# Patient Record
Sex: Female | Born: 1985 | Race: Black or African American | Hispanic: No | Marital: Married | Smoking: Former smoker
Health system: Southern US, Community
[De-identification: ages and names within clinical notes are randomized; demographics above are authoritative.]

## PROBLEM LIST (undated history)

## (undated) DIAGNOSIS — J45909 Unspecified asthma, uncomplicated: Secondary | ICD-10-CM

## (undated) DIAGNOSIS — T7840XA Allergy, unspecified, initial encounter: Secondary | ICD-10-CM

## (undated) DIAGNOSIS — M1990 Unspecified osteoarthritis, unspecified site: Secondary | ICD-10-CM

## (undated) DIAGNOSIS — R51 Headache: Secondary | ICD-10-CM

## (undated) HISTORY — PX: KNEE SURGERY: SHX244

## (undated) HISTORY — PX: GUM SURGERY: SHX658

## (undated) HISTORY — DX: Headache: R51

## (undated) HISTORY — DX: Allergy, unspecified, initial encounter: T78.40XA

---
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
## 2006-03-23 ENCOUNTER — Encounter: Admission: RE | Admit: 2006-03-23 | Discharge: 2006-06-21 | Payer: Self-pay | Admitting: Family Medicine

## 2006-05-11 ENCOUNTER — Encounter: Admission: RE | Admit: 2006-05-11 | Discharge: 2006-08-09 | Payer: Self-pay | Admitting: Family Medicine

## 2006-06-15 ENCOUNTER — Ambulatory Visit: Payer: Self-pay | Admitting: Family Medicine

## 2016-01-21 ENCOUNTER — Emergency Department (HOSPITAL_COMMUNITY)
Admission: EM | Admit: 2016-01-21 | Discharge: 2016-01-21 | Disposition: A | Discharge status: Home / Self Care | Payer: Managed Care, Other (non HMO) | Attending: Emergency Medicine | Admitting: Emergency Medicine

## 2016-01-21 ENCOUNTER — Encounter (HOSPITAL_COMMUNITY): Payer: Self-pay | Admitting: Emergency Medicine

## 2016-01-21 DIAGNOSIS — Z79899 Other long term (current) drug therapy: Secondary | ICD-10-CM | POA: Insufficient documentation

## 2016-01-21 DIAGNOSIS — Z791 Long term (current) use of non-steroidal anti-inflammatories (NSAID): Secondary | ICD-10-CM | POA: Diagnosis not present

## 2016-01-21 DIAGNOSIS — Z87891 Personal history of nicotine dependence: Secondary | ICD-10-CM | POA: Diagnosis not present

## 2016-01-21 DIAGNOSIS — J039 Acute tonsillitis, unspecified: Secondary | ICD-10-CM | POA: Diagnosis not present

## 2016-01-21 DIAGNOSIS — J029 Acute pharyngitis, unspecified: Secondary | ICD-10-CM | POA: Diagnosis present

## 2016-01-21 HISTORY — DX: Unspecified osteoarthritis, unspecified site: M19.90

## 2016-01-21 LAB — RAPID STREP SCREEN (MED CTR MEBANE ONLY): Streptococcus, Group A Screen (Direct): NEGATIVE

## 2016-01-21 MED ORDER — KETOROLAC TROMETHAMINE 30 MG/ML IJ SOLN
30.0000 mg | Freq: Once | INTRAMUSCULAR | Status: DC
  Filled 2016-01-21: qty 1

## 2016-01-21 MED ORDER — ACETAMINOPHEN 500 MG PO TABS: 1000.0000 mg | ORAL_TABLET | Freq: Once | ORAL | Status: DC

## 2016-01-21 MED ORDER — KETOROLAC TROMETHAMINE 30 MG/ML IJ SOLN
30.0000 mg | Freq: Once | INTRAMUSCULAR | Status: AC
  Administered 2016-01-21: 30 mg via INTRAMUSCULAR

## 2016-01-21 MED ORDER — DEXAMETHASONE SODIUM PHOSPHATE 10 MG/ML IJ SOLN
10.0000 mg | Freq: Once | INTRAMUSCULAR | Status: AC
  Administered 2016-01-21: 10 mg via INTRAMUSCULAR
  Filled 2016-01-21: qty 1

## 2016-01-21 MED ORDER — ACETAMINOPHEN 325 MG PO TABS
650.0000 mg | ORAL_TABLET | Freq: Once | ORAL | Status: AC | PRN
  Administered 2016-01-21: 650 mg via ORAL
  Filled 2016-01-21: qty 2

## 2016-01-21 NOTE — Discharge Instructions (Signed)
There does not appear to be an emergent cause for your symptoms at this time. You may continue taking ibuprofen and Tylenol as we discussed for discomfort/fevers. Stay well-hydrated as we discussed. Follow up with your doctor in the next 2-3 days for reevaluation. Return to ED for any new or worsening symptoms as we discussed.  Tonsillitis Tonsillitis is an infection of the throat that causes the tonsils to become red, tender, and swollen. Tonsils are collections of lymphoid tissue at the back of the throat. Each tonsil has crevices (crypts). Tonsils help fight nose and throat infections and keep infection from spreading to other parts of the body for the first 18 months of life.  CAUSES Sudden (acute) tonsillitis is usually caused by infection with streptococcal bacteria. Long-lasting (chronic) tonsillitis occurs when the crypts of the tonsils become filled with pieces of food and bacteria, which makes it easy for the tonsils to become repeatedly infected. SYMPTOMS  Symptoms of tonsillitis include:  A sore throat, with possible difficulty swallowing.  White patches on the tonsils.  Fever.  Tiredness.  New episodes of snoring during sleep, when you did not snore before.  Small, foul-smelling, yellowish-white pieces of material (tonsilloliths) that you occasionally cough up or spit out. The tonsilloliths can also cause you to have bad breath. DIAGNOSIS Tonsillitis can be diagnosed through a physical exam. Diagnosis can be confirmed with the results of lab tests, including a throat culture. TREATMENT  The goals of tonsillitis treatment include the reduction of the severity and duration of symptoms and prevention of associated conditions. Symptoms of tonsillitis can be improved with the use of steroids to reduce the swelling. Tonsillitis caused by bacteria can be treated with antibiotic medicines. Usually, treatment with antibiotic medicines is started before the cause of the tonsillitis is  known. However, if it is determined that the cause is not bacterial, antibiotic medicines will not treat the tonsillitis. If attacks of tonsillitis are severe and frequent, your health care provider may recommend surgery to remove the tonsils (tonsillectomy). HOME CARE INSTRUCTIONS   Rest as much as possible and get plenty of sleep.  Drink plenty of fluids. While the throat is very sore, eat soft foods or liquids, such as sherbet, soups, or instant breakfast drinks.  Eat frozen ice pops.  Gargle with a warm or cold liquid to help soothe the throat. Mix 1/4 teaspoon of salt and 1/4 teaspoon of baking soda in 8 oz of water. SEEK MEDICAL CARE IF:   Large, tender lumps develop in your neck.  A rash develops.  A green, yellow-brown, or bloody substance is coughed up.  You are unable to swallow liquids or food for 24 hours.  You notice that only one of the tonsils is swollen. SEEK IMMEDIATE MEDICAL CARE IF:   You develop any new symptoms such as vomiting, severe headache, stiff neck, chest pain, or trouble breathing or swallowing.  You have severe throat pain along with drooling or voice changes.  You have severe pain, unrelieved with recommended medications.  You are unable to fully open the mouth.  You develop redness, swelling, or severe pain anywhere in the neck.  You have a fever. MAKE SURE YOU:   Understand these instructions.  Will watch your condition.  Will get help right away if you are not doing well or get worse.   This information is not intended to replace advice given to you by your health care provider. Make sure you discuss any questions you have with your health care  provider.   Document Released: 04/28/2005 Document Revised: 08/09/2014 Document Reviewed: 01/05/2013 Elsevier Interactive Patient Education [employer].

## 2016-01-21 NOTE — ED Notes (Signed)
Pt reports sore throat starting Sunday, reports seeing PCP on Monday and starting Augmentin.  Pt reports no improvement.  Resp e/u.

## 2016-01-21 NOTE — ED Notes (Signed)
Pt ambulated to room from waiting room. 

## 2016-01-21 NOTE — ED Provider Notes (Signed)
CSN: [id number]     Arrival date & time 01/21/16  4098 History   First MD Initiated Contact with Patient 01/21/16 [phone]     Chief Complaint  Patient presents with  . Sore Throat     (Consider location/radiation/quality/duration/timing/severity/associated sxs/prior Treatment) HPI Brooke Bryant is a 30 y.o. female here for evaluation of sore throat. Patient reports she was seen at [facility] on Sunday, had a negative strep test, but was treated with Augmentin for possible strep throat. She reports symptoms did not improve, was seen again today at [facility] and referred to ED for further evaluation. She denies any difficulties breathing, cough, drooling. She reports fevers 2 days ago, none today. She does report recent unprotected oral sex. No other modifying factors.  Past Medical History  Diagnosis Date  . Arthritis    Past Surgical History  Procedure Laterality Date  . Knee surgery Left   . Gum surgery     No family history on file. Social History  Substance Use Topics  . Smoking status: Former Smoker    Quit date: 01/21/2004  . Smokeless tobacco: Never Used  . Alcohol Use: 4.2 oz/week    7 Shots of liquor per week   OB History    No data available     Review of Systems A 10 point review of systems was completed and was negative except for pertinent positives and negatives as mentioned in the history of present illness     Allergies  Review of patient's allergies indicates no known allergies.  Home Medications   Prior to Admission medications   Medication Sig Start Date End Date Taking? Authorizing Provider  amoxicillin-clavulanate (AUGMENTIN) 875-125 MG tablet Take 1 tablet by mouth 2 (two) times daily. Started on Monday. 01/19/16 01/28/16 Yes Historical Provider, MD  ibuprofen (ADVIL,MOTRIN) 200 MG tablet Take 400 mg by mouth every 4 (four) hours as needed for fever.   Yes Historical Provider, MD  lidocaine (XYLOCAINE) 2 % solution Use as directed 5 mLs in the  mouth or throat every 6 (six) hours as needed for mouth pain.   Yes Historical Provider, MD   BP 99/78 mmHg  Pulse 93  Temp(Src) 97.9 F (36.6 C) (Oral)  Resp 18  Ht  (1.651 m)  Wt 136.986 kg  BMI 50.26 kg/m2  SpO2 98%  LMP 01/01/2016 (Exact Date) Physical Exam  Constitutional: She is oriented to person, place, and time. She appears well-developed and well-nourished.  HENT:  Head: Normocephalic and atraumatic.  Mouth/Throat: Oropharynx is clear and moist.  Bilateral tonsillitis with exudate. Uvula is midline. No glossal elevation. No trismus. Tolerating secretions well.  Eyes: Conjunctivae are normal. Pupils are equal, round, and reactive to light. Right eye exhibits no discharge. Left eye exhibits no discharge. No scleral icterus.  Neck: Normal range of motion. Neck supple.  Cardiovascular: Normal rate, regular rhythm and normal heart sounds.   Pulmonary/Chest: Effort normal and breath sounds normal. No respiratory distress. She has no wheezes. She has no rales.  Abdominal: Soft. There is no tenderness.  No organomegaly. Abdomen is soft, nontender, non-distended without rebound or guarding.  Musculoskeletal: She exhibits no tenderness.  Neurological: She is alert and oriented to person, place, and time.  Cranial Nerves II-XII grossly intact  Skin: Skin is warm and dry. No rash noted.  Psychiatric: She has a normal mood and affect.  Nursing note and vitals reviewed.   ED Course  Procedures (including critical care time) Labs Review Labs Reviewed  RAPID  STREP SCREEN (NOT AT [facility])  CULTURE, GROUP A STREP ([facility])  GC/CHLAMYDIA PROBE AMP ([place]) NOT AT [facility]    Imaging Review No results found. I have personally reviewed and evaluated these images and lab results as part of my medical decision-making.   EKG Interpretation None      MDM  Patient presentation consistent with likely viral tonsillitis. Rapid strep neg. Treated empirically emergency department with  Toradol, Decadron for symptom improvement. Low suspicion for peritonsillar or retropharyngeal abscess. Doubt other deep space infection. No trismus, glossal elevation, unilateral tonsillar swelling, uvula is midline. Pending GC chlamydia oral swab. Continue with previous Rx for augmentin Discussed with Dr. Jeraldine LootsLockwood. Discussed benefits and risks associated with CT of neck, patient prefers to try symptomatic treatment before and will return for any worsening signs to obtain CT of neck to evaluate for possible infection. States she feels much better, is ready to go home.  HDS, afebrile and appropriate for DC. Final diagnoses:  Tonsillitis with exudate       Joycie PeekBenjamin Djuan Talton, PA-C 01/22/16 1556  Gerhard Munchobert Lockwood, MD 01/22/16 2330

## 2016-01-22 LAB — GC/CHLAMYDIA PROBE AMP (~~LOC~~) NOT AT ARMC
Chlamydia: NEGATIVE
Neisseria Gonorrhea: NEGATIVE

## 2016-01-23 LAB — CULTURE, GROUP A STREP (THRC)

## 2016-01-27 ENCOUNTER — Encounter (HOSPITAL_COMMUNITY): Payer: Self-pay | Admitting: Emergency Medicine

## 2016-01-27 ENCOUNTER — Emergency Department (HOSPITAL_COMMUNITY)
Admission: EM | Admit: 2016-01-27 | Discharge: 2016-01-27 | Disposition: A | Discharge status: Home / Self Care | Payer: Managed Care, Other (non HMO) | Attending: Emergency Medicine | Admitting: Emergency Medicine

## 2016-01-27 DIAGNOSIS — L0231 Cutaneous abscess of buttock: Secondary | ICD-10-CM | POA: Insufficient documentation

## 2016-01-27 DIAGNOSIS — Z87891 Personal history of nicotine dependence: Secondary | ICD-10-CM | POA: Insufficient documentation

## 2016-01-27 NOTE — Discharge Instructions (Signed)
Abscess °An abscess is an infected area that contains a collection of pus and debris. It can occur in almost any part of the body. An abscess is also known as a furuncle or boil. °CAUSES  °An abscess occurs when tissue gets infected. This can occur from blockage of oil or sweat glands, infection of hair follicles, or a minor injury to the skin. As the body tries to fight the infection, pus collects in the area and creates pressure under the skin. This pressure causes pain. People with weakened immune systems have difficulty fighting infections and get certain abscesses more often.  °SYMPTOMS °Usually an abscess develops on the skin and becomes a painful mass that is red, warm, and tender. If the abscess forms under the skin, you may feel a moveable soft area under the skin. Some abscesses break open (rupture) on their own, but most will continue to get worse without care. The infection can spread deeper into the body and eventually into the bloodstream, causing you to feel ill.  °DIAGNOSIS  °Your caregiver will take your medical history and perform a physical exam. A sample of fluid may also be taken from the abscess to determine what is causing your infection. °TREATMENT  °Your caregiver may prescribe antibiotic medicines to fight the infection. However, taking antibiotics alone usually does not cure an abscess. Your caregiver may need to make a small cut (incision) in the abscess to drain the pus. In some cases, gauze is packed into the abscess to reduce pain and to continue draining the area. °HOME CARE INSTRUCTIONS  °· Only take over-the-counter or prescription medicines for pain, discomfort, or fever as directed by your caregiver. °· If you were prescribed antibiotics, take them as directed. Finish them even if you start to feel better. °· If gauze is used, follow your caregiver's directions for changing the gauze. °· To avoid spreading the infection: °· Keep your draining abscess covered with a  bandage. °· Wash your hands well. °· Do not share personal care items, towels, or whirlpools with others. °· Avoid skin contact with others. °· Keep your skin and clothes clean around the abscess. °· Keep all follow-up appointments as directed by your caregiver. °SEEK MEDICAL CARE IF:  °· You have increased pain, swelling, redness, fluid drainage, or bleeding. °· You have muscle aches, chills, or a general ill feeling. °· You have a fever. °MAKE SURE YOU:  °· Understand these instructions. °· Will watch your condition. °· Will get help right away if you are not doing well or get worse. °  °This information is not intended to replace advice given to you by your health care provider. Make sure you discuss any questions you have with your health care provider. °  °Document Released: 04/28/2005 Document Revised: 01/18/2012 Document Reviewed: 10/01/2011 °Elsevier Interactive Patient Education ©2016 Elsevier Inc[employer] ° °Incision and Drainage °Incision and drainage is a procedure in which a sac-like structure (cystic structure) is opened and drained. The area to be drained usually contains material such as pus, fluid, or blood.  °LET YOUR CAREGIVER KNOW ABOUT:  °· Allergies to medicine. °· Medicines taken, including vitamins, herbs, eyedrops, over-the-counter medicines, and creams. °· Use of steroids (by mouth or creams). °· Previous problems with anesthetics or numbing medicines. °· History of bleeding problems or blood clots. °· Previous surgery. °· Other health problems, including diabetes and kidney problems. °· Possibility of pregnancy, if this applies. °RISKS AND COMPLICATIONS °· Pain. °· Bleeding. °· Scarring. °· Infection. °BEFORE THE PROCEDURE  °  You may need to have an ultrasound or other imaging tests to see how large or deep your cystic structure is. Blood tests may also be used to determine if you have an infection or how severe the infection is. You may need to have a tetanus shot. °PROCEDURE  °The affected area  is cleaned with a cleaning fluid. The cyst area will then be numbed with a medicine (local anesthetic). A small incision will be made in the cystic structure. A syringe or catheter may be used to drain the contents of the cystic structure, or the contents may be squeezed out. The area will then be flushed with a cleansing solution. After cleansing the area, it is often gently packed with a gauze or another wound dressing. Once it is packed, it will be covered with gauze and tape or some other type of wound dressing.  °AFTER THE PROCEDURE  °· Often, you will be allowed to go home right after the procedure. °· You may be given antibiotic medicine to prevent or heal an infection. °· If the area was packed with gauze or some other wound dressing, you will likely need to come back in 1 to 2 days to get it removed. °· The area should heal in about 14 days. °  °This information is not intended to replace advice given to you by your health care provider. Make sure you discuss any questions you have with your health care provider. °  °Document Released: 01/12/2001 Document Revised: 01/18/2012 Document Reviewed: 09/13/2011 °Elsevier Interactive Patient Education ©2016 Elsevier Inc. ° °

## 2016-01-27 NOTE — ED Provider Notes (Signed)
CSN: [id number]     Arrival date & time 01/27/16  0509 History   First MD Initiated Contact with Patient 01/27/16 0703     Chief Complaint  Patient presents with  . Abscess   Patient is a 30 y.o. female presenting with abscess. The history is provided by the patient.  Abscess Location:  Ano-genital Ano-genital abscess location:  R buttock Abscess quality: fluctuance, induration and painful   Abscess quality: not draining and not weeping   Red streaking: no   Duration:  1 week Progression:  Worsening Pain details:    Quality:  Dull   Severity:  Moderate Relieved by:  Nothing Exacerbated by: palpation. Associated symptoms: no anorexia, no fatigue, no fever, no headaches, no nausea and no vomiting   Risk factors: prior abscess   Usually they drain on their own but this one has not.  Past Medical History  Diagnosis Date  . Arthritis    Past Surgical History  Procedure Laterality Date  . Knee surgery Left   . Gum surgery     No family history on file. Social History  Substance Use Topics  . Smoking status: Former Smoker    Quit date: 01/21/2004  . Smokeless tobacco: Never Used  . Alcohol Use: Yes   OB History    No data available     Review of Systems  Constitutional: Negative for fever and fatigue.  Gastrointestinal: Negative for nausea, vomiting and anorexia.  Neurological: Negative for headaches.      Allergies  Review of patient's allergies indicates no known allergies.  Home Medications   Prior to Admission medications   Medication Sig Start Date End Date Taking? Authorizing Provider  albuterol (PROVENTIL HFA;VENTOLIN HFA) 108 (90 Base) MCG/ACT inhaler Inhale 1-2 puffs into the lungs every 6 (six) hours as needed for wheezing or shortness of breath.   Yes Historical Provider, MD  amoxicillin-clavulanate (AUGMENTIN) 875-125 MG tablet Take 1 tablet by mouth 2 (two) times daily.  01/19/16 01/28/16 Yes Historical Provider, MD  ibuprofen (ADVIL,MOTRIN) 200 MG  tablet Take 400 mg by mouth every 4 (four) hours as needed for fever.   Yes Historical Provider, MD  lidocaine (XYLOCAINE) 2 % solution Use as directed 5 mLs in the mouth or throat every 6 (six) hours as needed for mouth pain.   Yes Historical Provider, MD  miconazole (MICOTIN) 100 MG vaginal suppository Place 100 mg vaginally at bedtime. For 7 days   Yes Historical Provider, MD   BP 118/61 mmHg  Pulse 70  Temp(Src) 97.8 F (36.6 C) (Oral)  Resp 18  Ht 5\' 5"  (1.651 m)  Wt 137.893 kg  BMI 50.59 kg/m2  SpO2 97%  LMP 01/07/2016 (Approximate) Physical Exam  Constitutional: She appears well-developed and well-nourished. No distress.  Obese   HENT:  Head: Normocephalic and atraumatic.  Right Ear: External ear normal.  Left Ear: External ear normal.  Eyes: Conjunctivae are normal. Right eye exhibits no discharge. Left eye exhibits no discharge. No scleral icterus.  Neck: Neck supple. No tracheal deviation present.  Cardiovascular: Normal rate.   Pulmonary/Chest: Effort normal. No stridor. No respiratory distress.  Musculoskeletal: She exhibits no edema.  approx 2 cm area of fluctuance, ttp, mild induration right buttock, medial aspect, no lymphangitic streaking  Neurological: She is alert. Cranial nerve deficit: no gross deficits.  Skin: Skin is warm and dry. No rash noted.  Psychiatric: She has a normal mood and affect.  Nursing note and vitals reviewed.   ED Course  ..[name]  Incision and Drainage Date/Time: 01/27/2016 7:45 AM Performed by: Linwood DibblesKNAPP, Ziare Orrick Authorized by: Linwood DibblesKNAPP, Arlethia Basso Consent: Verbal consent obtained. Risks and benefits: risks, benefits and alternatives were discussed Consent given by: patient Time out: Immediately prior to procedure a "time out" was called to verify the correct patient, procedure, equipment, support staff and site/side marked as required. Type: abscess Body area: anogenital Anesthesia: local infiltration Local anesthetic: lidocaine 1% without  epinephrine Anesthetic total: 3 ml Patient sedated: no Scalpel size: 11 Incision type: single straight Incision depth: subcutaneous Complexity: complex Drainage: purulent Drainage amount: moderate Wound treatment: wound left open Packing material: 1/4 in iodoform gauze Patient tolerance: Patient tolerated the procedure well with no immediate complications    MDM   Final diagnoses:  Abscess of buttock, right   I&D performed without difficulty.  Pt is feeling much better after the procedure.  Wound was irrigated and a small amount of packing was placed.  Instructed pt to remove the 1/4 gauze in tomorrow.  Continue warm soaks.   Linwood DibblesJon Sunita Demond, MD 01/27/16 [phone]

## 2016-01-27 NOTE — ED Notes (Signed)
Pt. reports skin abscess at right buttocks and lower abdomen onset this week with no drainage .

## 2017-07-18 DIAGNOSIS — Z3490 Encounter for supervision of normal pregnancy, unspecified, unspecified trimester: Secondary | ICD-10-CM | POA: Insufficient documentation

## 2017-07-19 ENCOUNTER — Ambulatory Visit (INDEPENDENT_AMBULATORY_CARE_PROVIDER_SITE_OTHER): Payer: 59 | Admitting: Obstetrics

## 2017-07-19 ENCOUNTER — Other Ambulatory Visit: Payer: Self-pay

## 2017-07-19 ENCOUNTER — Encounter: Payer: Self-pay | Admitting: Obstetrics

## 2017-07-19 VITALS — BP 121/64 | HR 73 | Temp 98.7°F | Wt 282.7 lb

## 2017-07-19 DIAGNOSIS — Z23 Encounter for immunization: Secondary | ICD-10-CM | POA: Diagnosis not present

## 2017-07-19 DIAGNOSIS — Z113 Encounter for screening for infections with a predominantly sexual mode of transmission: Secondary | ICD-10-CM | POA: Diagnosis not present

## 2017-07-19 DIAGNOSIS — O99211 Obesity complicating pregnancy, first trimester: Secondary | ICD-10-CM

## 2017-07-19 DIAGNOSIS — Z3401 Encounter for supervision of normal first pregnancy, first trimester: Secondary | ICD-10-CM

## 2017-07-19 DIAGNOSIS — Z124 Encounter for screening for malignant neoplasm of cervix: Secondary | ICD-10-CM | POA: Diagnosis not present

## 2017-07-19 DIAGNOSIS — Z3490 Encounter for supervision of normal pregnancy, unspecified, unspecified trimester: Secondary | ICD-10-CM

## 2017-07-19 DIAGNOSIS — E669 Obesity, unspecified: Secondary | ICD-10-CM

## 2017-07-19 DIAGNOSIS — Z1151 Encounter for screening for human papillomavirus (HPV): Secondary | ICD-10-CM

## 2017-07-19 DIAGNOSIS — O99210 Obesity complicating pregnancy, unspecified trimester: Secondary | ICD-10-CM

## 2017-07-19 MED ORDER — PRENATE PIXIE 10-0.6-0.4-200 MG PO CAPS: 1.0000 | ORAL_CAPSULE | Freq: Every day | ORAL | 4 refills | Status: DC

## 2017-07-19 NOTE — Patient Instructions (Addendum)
Prenatal Care WHAT IS PRENATAL CARE? Prenatal care is the process of caring for a pregnant woman before she gives birth. Prenatal care makes sure that she and her baby remain as healthy as possible throughout pregnancy. Prenatal care may be provided by a midwife, family practice health care provider, or a childbirth and pregnancy specialist (obstetrician). Prenatal care may include physical examinations, testing, treatments, and education on nutrition, lifestyle, and social support services. WHY IS PRENATAL CARE SO IMPORTANT? Early and consistent prenatal care increases the chance that you and your baby will remain healthy throughout your pregnancy. This type of care also decreases a baby's risk of being born too early (prematurely), or being born smaller than expected (small for gestational age). Any underlying medical conditions you may have that could pose a risk during your pregnancy are discussed during prenatal care visits. You will also be monitored regularly for any new conditions that may arise during your pregnancy so they can be treated quickly and effectively. WHAT HAPPENS DURING PRENATAL CARE VISITS? Prenatal care visits may include the following: Discussion Tell your health care provider about any new signs or symptoms you have experienced since your last visit. These might include:  Nausea or vomiting.  Increased or decreased level of energy.  Difficulty sleeping.  Back or leg pain.  Weight changes.  Frequent urination.  Shortness of breath with physical activity.  Changes in your skin, such as the development of a rash or itchiness.  Vaginal discharge or bleeding.  Feelings of excitement or nervousness.  Changes in your baby's movements.  You may want to write down any questions or topics you want to discuss with your health care provider and bring them with you to your appointment. Examination During your first prenatal care visit, you will likely have a complete  physical exam. Your health care provider will often examine your vagina, cervix, and the position of your uterus, as well as check your heart, lungs, and other body systems. As your pregnancy progresses, your health care provider will measure the size of your uterus and your baby's position inside your uterus. He or she may also examine you for early signs of labor. Your prenatal visits may also include checking your blood pressure and, after about 10-12 weeks of pregnancy, listening to your baby's heartbeat. Testing Regular testing often includes:  Urinalysis. This checks your urine for glucose, protein, or signs of infection.  Blood count. This checks the levels of white and red blood cells in your body.  Tests for sexually transmitted infections (STIs). Testing for STIs at the beginning of pregnancy is routinely done and is required in many states.  Antibody testing. You will be checked to see if you are immune to certain illnesses, such as rubella, that can affect a developing fetus.  Glucose screen. Around 24-28 weeks of pregnancy, your blood glucose level will be checked for signs of gestational diabetes. Follow-up tests may be recommended.  Group B strep. This is a bacteria that is commonly found inside a woman's vagina. This test will inform your health care provider if you need an antibiotic to reduce the amount of this bacteria in your body prior to labor and childbirth.  Ultrasound. Many pregnant women undergo an ultrasound screening around 18-20 weeks of pregnancy to evaluate the health of the fetus and check for any developmental abnormalities.  HIV (human immunodeficiency virus) testing. Early in your pregnancy, you will be screened for HIV. If you are at high risk for HIV, this test may   be repeated during your third trimester of pregnancy.  You may be offered other testing based on your age, personal or family medical history, or other factors. HOW OFTEN SHOULD I PLAN TO SEE MY  HEALTH CARE PROVIDER FOR PRENATAL CARE? Your prenatal care check-up schedule depends on any medical conditions you have before, or develop during, your pregnancy. If you do not have any underlying medical conditions, you will likely be seen for checkups:  Monthly, during the first 6 months of pregnancy.  Twice a month during months 7 and 8 of pregnancy.  Weekly starting in the 9th month of pregnancy and until delivery.  If you develop signs of early labor or other concerning signs or symptoms, you may need to see your health care provider more often. Ask your health care provider what prenatal care schedule is best for you. WHAT CAN I DO TO KEEP MYSELF AND MY BABY AS HEALTHY AS POSSIBLE DURING MY PREGNANCY?  Take a prenatal vitamin containing 400 micrograms (0.4 mg) of folic acid every day. Your health care provider may also ask you to take additional vitamins such as iodine, vitamin D, iron, copper, and zinc.  Take 1500-2000 mg of calcium daily starting at your 20th week of pregnancy until you deliver your baby.  Make sure you are up to date on your vaccinations. Unless directed otherwise by your health care provider: ? You should receive a tetanus, diphtheria, and pertussis (Tdap) vaccination between the 27th and 36th week of your pregnancy, regardless of when your last Tdap immunization occurred. This helps protect your baby from whooping cough (pertussis) after he or she is born. ? You should receive an annual inactivated influenza vaccine (IIV) to help protect you and your baby from influenza. This can be done at any point during your pregnancy.  Eat a well-rounded diet that includes: ? Fresh fruits and vegetables. ? Lean proteins. ? Calcium-rich foods such as milk, yogurt, hard cheeses, and dark, leafy greens. ? Whole grain breads.  Do noteat seafood high in mercury, including: ? Swordfish. ? Tilefish. ? Shark. ? King mackerel. ? More than 6 oz tuna per week.  Do not  eat: ? Raw or undercooked meats or eggs. ? Unpasteurized foods, such as soft cheeses (brie, blue, or feta), juices, and milks. ? Lunch meats. ? Hot dogs that have not been heated until they are steaming.  Drink enough water to keep your urine clear or pale yellow. For many women, this may be 10 or more 8 oz glasses of water each day. Keeping yourself hydrated helps deliver nutrients to your baby and may prevent the start of pre-term uterine contractions.  Do not use any tobacco products including cigarettes, chewing tobacco, or electronic cigarettes. If you need help quitting, ask your health care provider.  Do not drink beverages containing alcohol. No safe level of alcohol consumption during pregnancy has been determined.  Do not use any illegal drugs. These can harm your developing baby or cause a miscarriage.  Ask your health care provider or pharmacist before taking any prescription or over-the-counter medicines, herbs, or supplements.  Limit your caffeine intake to no more than 200 mg per day.  Exercise. Unless told otherwise by your health care provider, try to get 30 minutes of moderate exercise most days of the week. Do not  do high-impact activities, contact sports, or activities with a high risk of falling, such as horseback riding or downhill skiing.  Get plenty of rest.  Avoid anything that raises your  body temperature, such as hot tubs and saunas.  If you own a cat, do not empty its litter box. Bacteria contained in cat feces can cause an infection called toxoplasmosis. This can result in serious harm to the fetus.  Stay away from chemicals such as insecticides, lead, mercury, and cleaning or paint products that contain solvents.  Do not have any X-rays taken unless medically necessary.  Take a childbirth and breastfeeding preparation class. Ask your health care provider if you need a referral or recommendation.  This information is not intended to replace advice given  to you by your health care provider. Make sure you discuss any questions you have with your health care provider. Document Released: 07/22/2003 Document Revised: 12/22/2015 Document Reviewed: 10/03/2013 Elsevier Interactive Patient Education  2017 [employer]  How a Baby Grows During Pregnancy Pregnancy begins when a female's sperm enters a female's egg (fertilization). This happens in one of the tubes (fallopian tubes) that connect the ovaries to the womb (uterus). The fertilized egg is called an embryo until it reaches 10 weeks. From 10 weeks until birth, it is called a fetus. The fertilized egg moves down the fallopian tube to the uterus. Then it implants into the lining of the uterus and begins to grow. The developing fetus receives oxygen and nutrients through the pregnant woman's bloodstream and the tissues that grow (placenta) to support the fetus. The placenta is the life support system for the fetus. It provides nutrition and removes waste. Learning as much as you can about your pregnancy and how your baby is developing can help you enjoy the experience. It can also make you aware of when there might be a problem and when to ask questions. How long does a typical pregnancy last? A pregnancy usually lasts 280 days, or about 40 weeks. Pregnancy is divided into three trimesters:  First trimester: 0-13 weeks.  Second trimester: 14-27 weeks.  Third trimester: 28-40 weeks.  The day when your baby is considered ready to be born (full term) is your estimated date of delivery. How does my baby develop month by month? First month  The fertilized egg attaches to the inside of the uterus.  Some cells will form the placenta. Others will form the fetus.  The arms, legs, brain, spinal cord, lungs, and heart begin to develop.  At the end of the first month, the heart begins to beat.  Second month  The bones, inner ear, eyelids, hands, and feet form.  The genitals develop.  By the end of  8 weeks, all major organs are developing.  Third month  All of the internal organs are forming.  Teeth develop below the gums.  Bones and muscles begin to grow. The spine can flex.  The skin is transparent.  Fingernails and toenails begin to form.  Arms and legs continue to grow longer, and hands and feet develop.  The fetus is about 3 in (7.6 cm) long.  Fourth month  The placenta is completely formed.  The external sex organs, neck, outer ear, eyebrows, eyelids, and fingernails are formed.  The fetus can hear, swallow, and move its arms and legs.  The kidneys begin to produce urine.  The skin is covered with a white waxy coating (vernix) and very fine hair (lanugo).  Fifth month  The fetus moves around more and can be felt for the first time (quickening).  The fetus starts to sleep and wake up and may begin to suck its finger.  The nails grow  to the end of the fingers.  The organ in the digestive system that makes bile (gallbladder) functions and helps to digest the nutrients.  If your baby is a girl, eggs are present in her ovaries. If your baby is a boy, testicles start to move down into his scrotum.  Sixth month  The lungs are formed, but the fetus is not yet able to breathe.  The eyes open. The brain continues to develop.  Your baby has fingerprints and toe prints. Your baby's hair grows thicker.  At the end of the second trimester, the fetus is about 9 in (22.9 cm) long.  Seventh month  The fetus kicks and stretches.  The eyes are developed enough to sense changes in light.  The hands can make a grasping motion.  The fetus responds to sound.  Eighth month  All organs and body systems are fully developed and functioning.  Bones harden and taste buds develop. The fetus may hiccup.  Certain areas of the brain are still developing. The skull remains soft.  Ninth month  The fetus gains about  lb (0.23 kg) each week.  The lungs are fully  developed.  Patterns of sleep develop.  The fetus's head typically moves into a head-down position (vertex) in the uterus to prepare for birth. If the buttocks move into a vertex position instead, the baby is breech.  The fetus weighs 6-9 lbs (2.72-4.08 kg) and is 19-20 in (48.26-50.8 cm) long.  What can I do to have a healthy pregnancy and help my baby develop? Eating and Drinking  Eat a healthy diet. ? Talk with your health care provider to make sure that you are getting the nutrients that you and your baby need. ? Visit www.[employer].be to learn about creating a healthy diet.  Gain a healthy amount of weight during pregnancy as advised by your health care provider. This is usually 25-35 pounds. You may need to: ? Gain more if you were underweight before getting pregnant or if you are pregnant with more than one baby. ? Gain less if you were overweight or obese when you got pregnant.  Medicines and Vitamins  Take prenatal vitamins as directed by your health care provider. These include vitamins such as folic acid, iron, calcium, and vitamin D. They are important for healthy development.  Take medicines only as directed by your health care provider. Read labels and ask a pharmacist or your health care provider whether over-the-counter medicines, supplements, and prescription drugs are safe to take during pregnancy.  Activities  Be physically active as advised by your health care provider. Ask your health care provider to recommend activities that are safe for you to do, such as walking or swimming.  Do not participate in strenuous or extreme sports.  Lifestyle  Do not drink alcohol.  Do not use any tobacco products, including cigarettes, chewing tobacco, or electronic cigarettes. If you need help quitting, ask your health care provider.  Do not use illegal drugs.  Safety  Avoid exposure to mercury, lead, or other heavy metals. Ask your health care provider about common  sources of these heavy metals.  Avoid listeria infection during pregnancy. Follow these precautions: ? Do not eat soft cheeses or deli meats. ? Do not eat hot dogs unless they have been warmed up to the point of steaming, such as in the microwave oven. ? Do not drink unpasteurized milk.  Avoid toxoplasmosis infection during pregnancy. Follow these precautions: ? Do not change your cat's litter box,  if you have a cat. Ask someone else to do this for you. ? Wear gardening gloves while working in the yard.  General Instructions  Keep all follow-up visits as directed by your health care provider. This is important. This includes prenatal care and screening tests.  Manage any chronic health conditions. Work closely with your health care provider to keep conditions, such as diabetes, under control.  How do I know if my baby is developing well? At each prenatal visit, your health care provider will do several different tests to check on your health and keep track of your baby's development. These include:  Fundal height. ? Your health care provider will measure your growing belly from top to bottom using a tape measure. ? Your health care provider will also feel your belly to determine your baby's position.  Heartbeat. ? An ultrasound in the first trimester can confirm pregnancy and show a heartbeat, depending on how far along you are. ? Your health care provider will check your baby's heart rate at every prenatal visit. ? As you get closer to your delivery date, you may have regular fetal heart rate monitoring to make sure that your baby is not in distress.  Second trimester ultrasound. ? This ultrasound checks your baby's development. It also indicates your baby's gender.  What should I do if I have concerns about my baby's development? Always talk with your health care provider about any concerns that you may have. This information is not intended to replace advice given to you by your  health care provider. Make sure you discuss any questions you have with your health care provider. Document Released: 01/05/2008 Document Revised: 12/25/2015 Document Reviewed: 12/26/2013 Elsevier Interactive Patient Education  2018 [employer].  Pregnancy and Sex Your sex life may change during pregnancy as well as after your newborn arrives. It is normal to have questions about sex during pregnancy. All women are affected differently by pregnancy hormones. You may notice an increase or decrease in your sexual drive throughout your pregnancy. Also, your partner's attitude and sexual drive may change. Share the information in this document with your partner. Talk openly about how you feel about sex. When is it safe to have sex during pregnancy? Sex is generally considered safe throughout a normal low-risk pregnancy. Remember:  The fetus is protected by the uterus and the fluid-filled sac that surrounds the fetus (amniotic sac).  The cervix is closed or sealed during pregnancy.  The penis does not reach or harm the fetus during sex.  Sex and orgasms are not thought to cause miscarriages or early labor.  If you use lubricants, use a water-soluble product.  What risk factors make it unsafe to have sex while pregnant? The following complications or risk factors may make it necessary to limit sexual activity:  You have a history of miscarriage or preterm labor.  You have bleeding, discharge, fluid leakage, or contractions.  Your placenta may be partially covering or completely covering the opening to the cervix (placenta previa).  Your cervix is weak and opens easily (incompetent cervix).  Your partner has an STD (sexually transmitted disease). Avoid sex with the infected person or use a condom to prevent infection to the fetus.  You are unsure of your partner's sexual history. Avoid sex or use condoms.  You are having twins, triples, or other multiples.  Your health care provider  will help you determine whether sex during your pregnancy is safe. What practices are unsafe?  If you engage  in oral sex, you should avoid having your partner blow air into your vagina. Although very rare, this can send a dangerous air bubble into your bloodstream.  Anal sex is generally safe during pregnancy, but there can be a risk of spreading bacteria from the rectum and aggravating any hemorrhoids. This information is not intended to replace advice given to you by your health care provider. Make sure you discuss any questions you have with your health care provider. Document Released: 01/06/2010 Document Revised: 03/16/2016 Document Reviewed: 01/08/2016 Elsevier Interactive Patient Education  2018 [employer].  Pregnancy, The Father's Role A father has an important role during his partner's pregnancy, labor, delivery, and after the birth of the baby. It is important to help and support your partner through this new period. There are many physical and emotional changes that happen. To be helpful and supportive during this time, you should know and understand what is happening to your partner during pregnancy, labor, delivery, and after the baby is born. What are the stages of pregnancy? Pregnancy usually lasts about 40 weeks. The pregnancy is divided into three trimesters. First Trimester During the first 13 weeks, your partner may:  Feel tired.  Have painful breasts.  Feel nauseous or throw up.  Urinate more often.  Have mood changes.  All of these changes are normal. If they are happening, try to be helpful, supportive, and understanding. This may include helping with household duties and activities and spending more time with each other. Second Trimester During the next 14-28 weeks:  Your partner will likely feel better and more energetic.  This is the best time of the pregnancy to be more active together.  You will be able to see her belly showing the pregnancy.  You  may be able to feel the baby kick.  Your partner may have soreness or aching in her back as she gains weight. You can help her by carrying heavy things and by rubbing her back when she is feeling sore.  Third Trimester During the final 12 weeks, your partner may:  Become more uncomfortable as the baby grows.  Have a hard time doing everyday activities, and her balance may be off.  Have a hard time bending over.  Tire easily.  Have difficulty sleeping.  At this time, the birth of your baby is close. You and your partner may have concerns or questions. This is normal. Talk with each other and with your health care provider. Continue to help your partner with housework, encourage her to rest, and rub her sore back and legs, if this helps her. What can I expect or do during the pregnancy? You can expect to experience some changes. There are also many things you can do to help prepare you and your partner for your baby. Emotional Changes During your partner's pregnancy, emotional changes for you may include:  Having feelings of happiness, excitement, and pride.  Being concerned about having new responsibilities, such as financial or educational responsibilities.  Feeling overwhelmed or scared.  Being worried that a baby will change your relationship with your partner.  These feelings are normal. Talk about them openly with your partner and your health care provider. Prenatal Care Attend prenatal care visits with your partner. This is a good time for you to get to know your health care provider, follow the pregnancy, and ask questions.  Prenatal visits usually occur one time each month for six months, then every two weeks for two months, and then one time  each week during the last month. You may have more prenatal visits if your health care provider believes this is needed.  Your health care provider usually does an ultrasound of the baby at one of the prenatal visits. This may happen  more often if your health care provider thinks it is needed.  Sexual Activity Sexual intercourse is safe unless there is a problem with the pregnancy and your health care provider advises you to not have sexual intercourse. Because physical and emotional changes happen in pregnancy, your partner may not want to have sex during certain times. Trying different positions may make sexual intercourse more comfortable. However, always respect your partner's decision if she does not want to have sex. It is important for both of you to discuss your feelings and desires. Talk with your health care provider about any questions that you may have about sexual intercourse during pregnancy. Childbirth Classes Attend childbirth classes with your partner if you are able. Classes prepare you and help you to understand what happens during labor and delivery, and they help you and your partner to bond. There are even some classes that are only for new fathers. Classes also teach you and your partner:  Various relaxation techniques.  How to work with her labor pains.  How to focus during labor and delivery.  What should I know about labor and delivery? Many fathers want to be present while their partner is going through labor and delivery. You may:  Be asked to time the contractions, massage your partner's back, and breathe with her during the contractions.  Get to see and enjoy the excitement of your baby being born, and you may even be able to cut your baby's umbilical cord. If you feel like you might faint or you are uncomfortable, ask someone to help you.  Need to leave the room if a problem develops during labor or delivery.  A cesarean delivery, or C-section, is a procedure that may be used to deliver the baby. It is done through an incision in the abdomen and the uterus. A cesarean delivery may be scheduled or it may be an emergency procedure during labor and delivery. Most hospitals allow the father to be  in the room for a cesarean delivery unless it is an emergency. Recovery from a cesarean delivery usually requires more help from the father. What happens after delivery? After your baby is born, your partner will go through many changes again. These changes could last a few months or longer. Postpartum Depression Your partner may take awhile to regain her strength. She may also have feelings of sadness (postpartum blues or postpartum depression). If your partner is acting unusually sad or depressed, talk with your health care provider right away. This can be a serious medical condition that requires treatment. Breastfeeding Your partner may decide to breastfeed the baby. This helps with bonding between the mother and the baby, and breast milk is the best nutrition for your baby. You can feel included by burping the baby and bottle-feeding the baby with breast milk that was collected from the mother. This allows your partner to rest and helps you to bond with your baby. Sexual Activity It may take a few months for your partner's body to heal and be ready for sexual intercourse again. This may take longer after a cesarean delivery. If you have any questions about having sexual intercourse or if it is painful for your partner, talk with your health care provider. It is possible for  breastfeeding mothers to become pregnant even if they are not having menstrual periods. Use birth control (contraception) unless you and your partner would like to become pregnant again. What should I remember? Fatherhood and having a baby is an ongoing learning experience. It is common to be anxious, concerned, or afraid that you may not be taking care of your newborn baby properly. It is important to talk with your partner and your health care provider if you are worried or have any questions. This information is not intended to replace advice given to you by your health care provider. Make sure you discuss any questions you  have with your health care provider. Document Released: 01/05/2008 Document Revised: 12/22/2015 Document Reviewed: 04/05/2014 Elsevier Interactive Patient Education  2017 [employer].  Pregnancy and Travel Most pregnant woman can safely travel until the last month of the pregnancy. However, pregnant women with medical problems or problems with their pregnancy should limit or avoid travel. The best time to travel is between 14 and 28 weeks of the pregnancy. During this period, morning sickness should be minimal and other problems are less likely to develop. General travel tips Before you go:  Discuss your trip with your health care provider and get examined shortly before you go.  Get a copy of your medical records and be sure to take them with you.  Try to get names of doctors and hospitals in the area you will be visiting.  Pack your pillow if you can.  Get a good night's sleep the night before you make your trip.  During your trip:  Ask for locations of doctors and hospitals if you did not do this before leaving.  Wear flat, comfortable shoes.  Eat a balanced diet, drink a lot of fluids, and take your vitamins and supplements.  Do not wear yourself out.  Do not ride on a motorcycle.  Rest. If you spent a lot of time traveling, lie down for 30 or more minutes with your feet slightly raised after you reach your destination.  Tips for traveling to a foreign country Before you go:  Ask your health care provider if there are any medicines that are safe to take if you get diarrhea, constipation, nausea, or vomiting.  Make copies of your medical records in case you lose the originals.  During your trip:  Do not eat uncooked foods of any kind.  Drink bottled water and do not use ice.  Wash fruits and vegetables with hot, soapy water.  Only drink pasteurized milk.  Tips for traveling by car  Wear your seat belt properly.  If you are in the front seat, sit as far away  from the dashboard as possible to avoid getting hit hard if the air bag deploys in an accident.  Stop about every 2 hours to use the restroom and walk around. This helps the circulation in your legs.  Keep water, crackers, and fruit in the car.  Do not travel for more than 6 hours a day. Tips for traveling by bus  Before making a reservation, ask whether your bus will have a restroom.  Take water, crackers, and fruit with you.  Get out and walk around if and when the bus stops.  Move your arms and legs when seated. This helps with your circulation. Tips for traveling by train Before making a reservation, ask if your train will have a sleeping car and more than one restroom. Tips for traveling by airplane  Before booking your trip,  ask about the airline's rules about pregnancy. Pregnant women may be restricted from flying after a certain time of the pregnancy. Every airline has its own rules and regulations.  Ask whether the airplane cabin will be pressurized. Do not board an unpressurized plane that will fly above 7,000 ft (2,100 km).  Try to get a bulkhead or an aisle seat.  Wear layered clothing because the temperature in the cabin can change.  Take water, crackers, and fruit with you on the airplane.  Put all your medicines and medical records in your carry-on bag.  Avoid drinks with caffeine and do not eat a big meal.  Do not walk around the airplane to stretch your legs.  Move your arms and legs while sitting to help with your circulation.  Wear your seat belt at all times. Tips for traveling by cruise ship  Before booking your trip, ask the following questions: ? Are pregnant women allowed on the cruise ship? ? Is there a medical facility and health care provider on board? ? Does the ship dock in cities where there are health care providers and medical facilities?  Before booking your trip, ask your health care provider if: ? It is safe for you to take medicines  if you get seasick. ? It is safe for you to wear acupressure wristbands to prevent getting seasick. If your health care provider says it is safe, consider purchasing one. This information is not intended to replace advice given to you by your health care provider. Make sure you discuss any questions you have with your health care provider. Document Released: 07/01/2008 Document Revised: 12/25/2015 Document Reviewed: 06/15/2013 Elsevier Interactive Patient Education  2017 [employer]

## 2017-07-19 NOTE — Progress Notes (Signed)
NOB. FLU vaccine given in left arm, tolerated well.

## 2017-07-19 NOTE — Progress Notes (Signed)
Subjective:    Brooke Bryant is being seen today for her first obstetrical visit.  This is a planned pregnancy. She is at 5223w4d gestation. Her obstetrical history is significant for obesity. Relationship with FOB: spouse, living together. Patient does intend to breast feed. Pregnancy history fully reviewed.  The information documented in the HPI was reviewed and verified.  Menstrual History: OB History    Gravida Para Term Preterm AB Living   1             SAB TAB Ectopic Multiple Live Births                   Patient's last menstrual period was 04/29/2017 (exact date).    Past Medical History:  Diagnosis Date  . Arthritis   . Headache     Past Surgical History:  Procedure Laterality Date  . GUM SURGERY    . KNEE SURGERY Left      (Not in a hospital admission) No Known Allergies  Social History   Tobacco Use  . Smoking status: Former Smoker    Last attempt to quit: 01/21/2004    Years since quitting: 13.5  . Smokeless tobacco: Never Used  Substance Use Topics  . Alcohol use: No    Frequency: Never    Family History  Problem Relation Age of Onset  . Depression Mother   . Varicose Veins Mother   . Heart disease Father   . Hypertension Father   . Cancer Maternal Aunt   . Arthritis Maternal Grandfather   . Cancer Maternal Grandfather      Review of Systems Constitutional: negative for weight loss Gastrointestinal: negative for vomiting Genitourinary:negative for genital lesions and vaginal discharge and dysuria Musculoskeletal:negative for back pain Behavioral/Psych: negative for abusive relationship, depression, illegal drug usage and tobacco use    Objective:    BP 121/64   Pulse 73   Temp 98.7 F (37.1 C)   Wt 282 lb 11.2 oz (128.2 kg)   LMP 04/29/2017 (Exact Date)   BMI 47.04 kg/m  General Appearance:    Alert, cooperative, no distress, appears stated age  Head:    Normocephalic, without obvious abnormality, atraumatic  Eyes:    PERRL,  conjunctiva/corneas clear, EOM's intact, fundi    benign, both eyes  Ears:    Normal TM's and external ear canals, both ears  Nose:   Nares normal, septum midline, mucosa normal, no drainage    or sinus tenderness  Throat:   Lips, mucosa, and tongue normal; teeth and gums normal  Neck:   Supple, symmetrical, trachea midline, no adenopathy;    thyroid:  no enlargement/tenderness/nodules; no carotid   bruit or JVD  Back:     Symmetric, no curvature, ROM normal, no CVA tenderness  Lungs:     Clear to auscultation bilaterally, respirations unlabored  Chest Wall:    No tenderness or deformity   Heart:    Regular rate and rhythm, S1 and S2 normal, no murmur, rub   or gallop  Breast Exam:    No tenderness, masses, or nipple abnormality  Abdomen:     Soft, non-tender, bowel sounds active all four quadrants,    no masses, no organomegaly  Genitalia:    Normal female without lesion, discharge or tenderness  Extremities:   Extremities normal, atraumatic, no cyanosis or edema  Pulses:   2+ and symmetric all extremities  Skin:   Skin color, texture, turgor normal, no rashes or lesions  Lymph nodes:  Cervical, supraclavicular, and axillary nodes normal  Neurologic:   CNII-XII intact, normal strength, sensation and reflexes    throughout      Lab Review Urine pregnancy test Labs reviewed yes Radiologic studies reviewed no  Assessment:    Pregnancy at 5955w4d weeks    Plan:     1. Encounter for supervision of normal pregnancy, antepartum, unspecified gravidity Rx: - Obstetric Panel, Including HIV - Hemoglobinopathy evaluation - Cystic Fibrosis Mutation 97 - Culture, OB Urine - Cytology - PAP - Cervicovaginal ancillary only - Flu Vaccine QUAD 36+ mos IM (Fluarix, Quad PF) - Enroll Patient in Babyscripts - Prenat-FeAsp-Meth-FA-DHA w/o A (PRENATE PIXIE) 10-0.6-0.4-200 MG CAPS; Take 1 capsule by mouth daily before breakfast.  Dispense: 90 capsule; Refill: 4  2. Obesity affecting pregnancy,  antepartum   Prenatal vitamins.  Counseling provided regarding continued use of seat belts, cessation of alcohol consumption, smoking or use of illicit drugs; infection precautions i.e., influenza/TDAP immunizations, toxoplasmosis,CMV, parvovirus, listeria and varicella; workplace safety, exercise during pregnancy; routine dental care, safe medications, sexual activity, hot tubs, saunas, pools, travel, caffeine use, fish and methlymercury, potential toxins, hair treatments, varicose veins Weight gain recommendations per IOM guidelines reviewed: underweight/BMI< 18.5--> gain 28 - 40 lbs; normal weight/BMI 18.5 - 24.9--> gain 25 - 35 lbs; overweight/BMI 25 - 29.9--> gain 15 - 25 lbs; obese/BMI >30->gain  11 - 20 lbs Problem list reviewed and updated. FIRST/CF mutation testing/NIPT/QUAD SCREEN/fragile X/[name] population testing/Spinal muscular atrophy discussed: requested. Role of ultrasound in pregnancy discussed; fetal survey: requested. Amniocentesis discussed: not indicated   Follow up in 4 weeks. 50% of 20 min visit spent on counseling and coordination of care.

## 2017-07-20 ENCOUNTER — Other Ambulatory Visit: Payer: Self-pay | Admitting: Obstetrics

## 2017-07-20 DIAGNOSIS — N760 Acute vaginitis: Principal | ICD-10-CM

## 2017-07-20 DIAGNOSIS — B9689 Other specified bacterial agents as the cause of diseases classified elsewhere: Secondary | ICD-10-CM

## 2017-07-20 LAB — CERVICOVAGINAL ANCILLARY ONLY
Bacterial vaginitis: POSITIVE — AB
Candida vaginitis: NEGATIVE
Chlamydia: NEGATIVE
NEISSERIA GONORRHEA: NEGATIVE
TRICH (WINDOWPATH): NEGATIVE

## 2017-07-20 MED ORDER — METRONIDAZOLE 500 MG PO TABS: 500.0000 mg | ORAL_TABLET | Freq: Two times a day (BID) | ORAL | 2 refills | Status: DC

## 2017-07-21 LAB — OBSTETRIC PANEL, INCLUDING HIV
ANTIBODY SCREEN: NEGATIVE
BASOS ABS: 0 10*3/uL (ref 0.0–0.2)
BASOS: 0 %
EOS (ABSOLUTE): 0.4 10*3/uL (ref 0.0–0.4)
Eos: 3 %
HEMATOCRIT: 36.8 % (ref 34.0–46.6)
HEMOGLOBIN: 12.8 g/dL (ref 11.1–15.9)
HEP B S AG: NEGATIVE
HIV SCREEN 4TH GENERATION: NONREACTIVE
IMMATURE GRANS (ABS): 0 10*3/uL (ref 0.0–0.1)
Immature Granulocytes: 0 %
Lymphocytes Absolute: 2.8 10*3/uL (ref 0.7–3.1)
Lymphs: 22 %
MCH: 30.5 pg (ref 26.6–33.0)
MCHC: 34.8 g/dL (ref 31.5–35.7)
MCV: 88 fL (ref 79–97)
MONOCYTES: 8 %
Monocytes Absolute: 1 10*3/uL — ABNORMAL HIGH (ref 0.1–0.9)
Neutrophils Absolute: 8.5 10*3/uL — ABNORMAL HIGH (ref 1.4–7.0)
Neutrophils: 67 %
Platelets: 297 10*3/uL (ref 150–379)
RBC: 4.19 x10E6/uL (ref 3.77–5.28)
RDW: 13.3 % (ref 12.3–15.4)
RPR: NONREACTIVE
RUBELLA: 6.39 {index} (ref >0.99)
Rh Factor: POSITIVE
WBC: 12.7 10*3/uL — ABNORMAL HIGH (ref 3.4–10.8)

## 2017-07-21 LAB — HEMOGLOBINOPATHY EVALUATION
HEMOGLOBIN A2 QUANTITATION: 2.5 % (ref 1.8–3.2)
HGB A: 97.5 % (ref 96.4–98.8)
HGB C: 0 %
HGB S: 0 %
HGB VARIANT: 0 %
Hemoglobin F Quantitation: 0 % (ref 0.0–2.0)

## 2017-07-21 LAB — CYTOLOGY - PAP
Diagnosis: NEGATIVE
HPV: NOT DETECTED

## 2017-07-21 LAB — CULTURE, OB URINE

## 2017-07-21 LAB — URINE CULTURE, OB REFLEX

## 2017-07-31 ENCOUNTER — Encounter: Payer: Self-pay | Admitting: Obstetrics

## 2017-08-02 ENCOUNTER — Encounter: Payer: Self-pay | Admitting: Family Medicine

## 2017-08-02 DIAGNOSIS — Z6841 Body Mass Index (BMI) 40.0 and over, adult: Secondary | ICD-10-CM | POA: Insufficient documentation

## 2017-08-16 ENCOUNTER — Ambulatory Visit (INDEPENDENT_AMBULATORY_CARE_PROVIDER_SITE_OTHER): Payer: 59 | Admitting: Obstetrics

## 2017-08-16 ENCOUNTER — Encounter: Payer: Self-pay | Admitting: Obstetrics

## 2017-08-16 VITALS — BP 117/70 | HR 72 | Wt 290.0 lb

## 2017-08-16 DIAGNOSIS — Z3490 Encounter for supervision of normal pregnancy, unspecified, unspecified trimester: Secondary | ICD-10-CM

## 2017-08-16 DIAGNOSIS — L02231 Carbuncle of abdominal wall: Secondary | ICD-10-CM

## 2017-08-16 DIAGNOSIS — Z3402 Encounter for supervision of normal first pregnancy, second trimester: Secondary | ICD-10-CM

## 2017-08-16 MED ORDER — CLINDAMYCIN PHOSPHATE 1 % EX LOTN: TOPICAL_LOTION | Freq: Two times a day (BID) | CUTANEOUS | 2 refills | Status: DC

## 2017-08-16 NOTE — Progress Notes (Signed)
Subjective:  Brooke Bryant is a 32 y.o. G1P0 at 7823w4d being seen today for ongoing prenatal care.  She is currently monitored for the following issues for this low-risk pregnancy and has Supervision of normal pregnancy, antepartum and BMI 45.0-49.9, adult (HCC) on their problem list.  Patient reports sore on abdomen that comes and goes.  Had the same type sore on buttocks that had to be excised.  Contractions: Not present. Vag. Bleeding: None.  Movement: Present. Denies leaking of fluid.   The following portions of the patient's history were reviewed and updated as appropriate: allergies, current medications, past family history, past medical history, past social history, past surgical history and problem list. Problem list updated.  Objective:   Vitals:   08/16/17 0905  BP: 117/70  Pulse: 72  Weight: 290 lb (131.5 kg)    Fetal Status: Fetal Heart Rate (bpm): 150   Movement: Present     General:  Alert, oriented and cooperative. Patient is in no acute distress.  Skin: Skin is warm and dry. Healing carbuncles in abdominal panniculus    Cardiovascular: Normal heart rate noted  Respiratory: Normal respiratory effort, no problems with respiration noted  Abdomen: Soft, gravid, appropriate for gestational age. Pain/Pressure: Absent     Pelvic:  Cervical exam deferred        Extremities: Normal range of motion.  Edema: None  Mental Status: Normal mood and affect. Normal behavior. Normal judgment and thought content.   Urinalysis:      Assessment and Plan:  Pregnancy: G1P0 at 7723w4d  1. Encounter for supervision of normal pregnancy, antepartum, unspecified gravidity Rx: - AFP TETRA - US MFM OB COMP + 14 WK; Future  2. Carbuncle of abdominal wall Rx: - clindamycin (CLEOCIN-T) 1 % lotion; Apply topically 2 (two) times daily.  Dispense: 60 mL; Refill: 2   Preterm labor symptoms and general obstetric precautions including but not limited to vaginal bleeding, contractions, leaking  of fluid and fetal movement were reviewed in detail with the patient. Please refer to After Visit Summary for other counseling recommendations.  Return in about 4 weeks (around 09/13/2017) for ROB.   Brock BadHarper, Leanor Voris A, MD

## 2017-08-16 NOTE — Progress Notes (Signed)
Pt c/o abscess that comes and goes located under lower abdomen area per pt

## 2017-08-17 ENCOUNTER — Telehealth: Payer: Self-pay

## 2017-08-17 NOTE — Progress Notes (Signed)
Patient called and requested another letter without the prolonged standing restriction.  Her job is also faxing paperwork.

## 2017-08-17 NOTE — Telephone Encounter (Signed)
Notified patient that the letter she requested was ready.

## 2017-08-19 LAB — AFP TETRA
DIA MOM VALUE: 1.13
DIA Value (EIA): 138.92 pg/mL
DSR (By Age)    1 IN: 578
DSR (SECOND TRIMESTER) 1 IN: 1900
GESTATIONAL AGE AFP: 15.6 wk
MATERNAL AGE AT EDD: 31.4 a
MSAFP Mom: 1.22
MSAFP: 27.4 ng/mL
MSHCG Mom: 0.63
MSHCG: 18302 m[IU]/mL
OSB RISK: 10000
T18 (By Age): 1:2253 {titer}
TEST RESULTS AFP: NEGATIVE
UE3 MOM: 0.57
WEIGHT: 290 [lb_av]
uE3 Value: 0.34 ng/mL

## 2017-09-01 ENCOUNTER — Encounter (HOSPITAL_COMMUNITY): Payer: Self-pay | Admitting: Obstetrics

## 2017-09-05 ENCOUNTER — Encounter: Payer: Self-pay | Admitting: Obstetrics

## 2017-09-09 ENCOUNTER — Other Ambulatory Visit: Payer: Self-pay | Admitting: Obstetrics

## 2017-09-09 ENCOUNTER — Ambulatory Visit (HOSPITAL_COMMUNITY)
Admission: RE | Admit: 2017-09-09 | Discharge: 2017-09-09 | Disposition: A | Discharge status: Home / Self Care | Payer: 59 | Source: Ambulatory Visit | Attending: Obstetrics | Admitting: Obstetrics

## 2017-09-09 DIAGNOSIS — O99212 Obesity complicating pregnancy, second trimester: Secondary | ICD-10-CM | POA: Insufficient documentation

## 2017-09-09 DIAGNOSIS — Z3A19 19 weeks gestation of pregnancy: Secondary | ICD-10-CM | POA: Insufficient documentation

## 2017-09-09 DIAGNOSIS — Z363 Encounter for antenatal screening for malformations: Secondary | ICD-10-CM | POA: Insufficient documentation

## 2017-09-09 DIAGNOSIS — Z3490 Encounter for supervision of normal pregnancy, unspecified, unspecified trimester: Secondary | ICD-10-CM

## 2017-09-13 ENCOUNTER — Encounter: Payer: Self-pay | Admitting: Obstetrics

## 2017-09-13 ENCOUNTER — Encounter: Payer: 59 | Admitting: Obstetrics

## 2017-09-13 ENCOUNTER — Ambulatory Visit (INDEPENDENT_AMBULATORY_CARE_PROVIDER_SITE_OTHER): Payer: 59 | Admitting: Obstetrics

## 2017-09-13 DIAGNOSIS — Z3490 Encounter for supervision of normal pregnancy, unspecified, unspecified trimester: Secondary | ICD-10-CM

## 2017-09-13 DIAGNOSIS — Z3492 Encounter for supervision of normal pregnancy, unspecified, second trimester: Secondary | ICD-10-CM

## 2017-09-13 NOTE — Progress Notes (Signed)
Anatomy on 09/09/2017

## 2017-09-14 ENCOUNTER — Encounter: Payer: Self-pay | Admitting: Obstetrics

## 2017-09-14 NOTE — Progress Notes (Signed)
Subjective:  Amandamarie A Adaline SillHemingway is a 32 y.o. G1P0 at 6952w5d being seen today for ongoing prenatal care.  She is currently monitored for the following issues for this low-risk pregnancy and has Supervision of normal pregnancy, antepartum and BMI 45.0-49.9, adult (HCC) on their problem list.  Patient reports no complaints.  Contractions: Not present. Vag. Bleeding: None.  Movement: Present. Denies leaking of fluid.   The following portions of the patient's history were reviewed and updated as appropriate: allergies, current medications, past family history, past medical history, past social history, past surgical history and problem list. Problem list updated.  Objective:   Vitals:   09/13/17 1527  BP: 115/77  Pulse: 85  Weight: 297 lb (134.7 kg)    Fetal Status: Fetal Heart Rate (bpm): 150   Movement: Present     General:  Alert, oriented and cooperative. Patient is in no acute distress.  Skin: Skin is warm and dry. No rash noted.   Cardiovascular: Normal heart rate noted  Respiratory: Normal respiratory effort, no problems with respiration noted  Abdomen: Soft, gravid, appropriate for gestational age. Pain/Pressure: Absent     Pelvic:  Cervical exam deferred        Extremities: Normal range of motion.     Mental Status: Normal mood and affect. Normal behavior. Normal judgment and thought content.   Urinalysis:      Assessment and Plan:  Pregnancy: G1P0 at 152w5d  1. Encounter for supervision of normal pregnancy, antepartum, unspecified gravidity   Preterm labor symptoms and general obstetric precautions including but not limited to vaginal bleeding, contractions, leaking of fluid and fetal movement were reviewed in detail with the patient. Please refer to After Visit Summary for other counseling recommendations.  Return in about 4 weeks (around 10/11/2017) for ROB.   Brock BadHarper, Charles A, MD

## 2017-10-10 ENCOUNTER — Ambulatory Visit (INDEPENDENT_AMBULATORY_CARE_PROVIDER_SITE_OTHER): Payer: 59 | Admitting: Obstetrics

## 2017-10-10 ENCOUNTER — Encounter: Payer: Self-pay | Admitting: Obstetrics

## 2017-10-10 VITALS — BP 114/66 | HR 76 | Temp 98.8°F | Wt 303.0 lb

## 2017-10-10 DIAGNOSIS — O99212 Obesity complicating pregnancy, second trimester: Secondary | ICD-10-CM

## 2017-10-10 DIAGNOSIS — Z3490 Encounter for supervision of normal pregnancy, unspecified, unspecified trimester: Secondary | ICD-10-CM

## 2017-10-10 DIAGNOSIS — L02231 Carbuncle of abdominal wall: Secondary | ICD-10-CM

## 2017-10-10 DIAGNOSIS — O99210 Obesity complicating pregnancy, unspecified trimester: Secondary | ICD-10-CM

## 2017-10-10 DIAGNOSIS — Z3492 Encounter for supervision of normal pregnancy, unspecified, second trimester: Secondary | ICD-10-CM

## 2017-10-10 NOTE — Progress Notes (Signed)
ROB w/ c/o: Morning sickness, yellow vomit ,no BM, No urge to eat  , No fever, no sweats upset stomach , no diarrhea.

## 2017-10-10 NOTE — Progress Notes (Signed)
Subjective:  Brooke Bryant is a 32 y.o. G1P0 at 4822w3d being seen today for ongoing prenatal care.  She is currently monitored for the following issues for this low-risk pregnancy and has Supervision of normal pregnancy, antepartum and BMI 45.0-49.9, adult (HCC) on their problem list.  Patient reports nausea.  Contractions: Not present. Vag. Bleeding: None.  Movement: Present. Denies leaking of fluid.   The following portions of the patient's history were reviewed and updated as appropriate: allergies, current medications, past family history, past medical history, past social history, past surgical history and problem list. Problem list updated.  Objective:   Vitals:   10/10/17 1600  BP: 114/66  Pulse: 76  Temp: 98.8 F (37.1 C)  Weight: (!) 303 lb (137.4 kg)    Fetal Status: Fetal Heart Rate (bpm): 150   Movement: Present     General:  Alert, oriented and cooperative. Patient is in no acute distress.  Skin: Skin is warm and dry. No rash noted.   Cardiovascular: Normal heart rate noted  Respiratory: Normal respiratory effort, no problems with respiration noted  Abdomen: Soft, gravid, appropriate for gestational age. Pain/Pressure: Present     Pelvic:  Cervical exam deferred        Extremities: Normal range of motion.     Mental Status: Normal mood and affect. Normal behavior. Normal judgment and thought content.   Urinalysis:      Assessment and Plan:  Pregnancy: G1P0 at 2122w3d  1. Encounter for supervision of normal pregnancy, antepartum, unspecified gravidity  2. Carbuncle of abdominal wall - healing well  3. Obesity affecting pregnancy, antepartum   Preterm labor symptoms and general obstetric precautions including but not limited to vaginal bleeding, contractions, leaking of fluid and fetal movement were reviewed in detail with the patient. Please refer to After Visit Summary for other counseling recommendations.  Return in about 4 weeks (around 11/07/2017) for  ROB, 2 hour OGTT.   Brock BadHarper, Charles A, MD

## 2017-10-11 ENCOUNTER — Encounter: Payer: 59 | Admitting: Obstetrics

## 2017-11-07 ENCOUNTER — Other Ambulatory Visit (INDEPENDENT_AMBULATORY_CARE_PROVIDER_SITE_OTHER): Payer: 59

## 2017-11-07 ENCOUNTER — Encounter: Payer: Self-pay | Admitting: Obstetrics

## 2017-11-07 ENCOUNTER — Ambulatory Visit (INDEPENDENT_AMBULATORY_CARE_PROVIDER_SITE_OTHER): Payer: 59 | Admitting: Obstetrics

## 2017-11-07 VITALS — BP 112/77 | HR 82 | Wt 311.2 lb

## 2017-11-07 DIAGNOSIS — Z3400 Encounter for supervision of normal first pregnancy, unspecified trimester: Secondary | ICD-10-CM

## 2017-11-07 DIAGNOSIS — M545 Low back pain, unspecified: Secondary | ICD-10-CM

## 2017-11-07 DIAGNOSIS — Z3402 Encounter for supervision of normal first pregnancy, second trimester: Secondary | ICD-10-CM

## 2017-11-07 DIAGNOSIS — Z3483 Encounter for supervision of other normal pregnancy, third trimester: Secondary | ICD-10-CM

## 2017-11-07 DIAGNOSIS — Z23 Encounter for immunization: Secondary | ICD-10-CM | POA: Diagnosis not present

## 2017-11-07 MED ORDER — COMFORT FIT MATERNITY SUPP SM MISC: 0 refills | Status: DC

## 2017-11-07 NOTE — Progress Notes (Signed)
Subjective:  Brooke Bryant is a 32 y.o. G1P0 at 7056w3d being seen today for ongoing prenatal care.  She is currently monitored for the following issues for this low-risk pregnancy and has Supervision of normal pregnancy, antepartum and BMI 45.0-49.9, adult (HCC) on their problem list.  Patient reports backache.  Contractions: Not present. Vag. Bleeding: None.  Movement: Present. Denies leaking of fluid.   The following portions of the patient's history were reviewed and updated as appropriate: allergies, current medications, past family history, past medical history, past social history, past surgical history and problem list. Problem list updated.  Objective:   Vitals:   11/07/17 0807  BP: 112/77  Pulse: 82  Weight: (!) 311 lb 3.2 oz (141.2 kg)    Fetal Status: Fetal Heart Rate (bpm): 150   Movement: Present     General:  Alert, oriented and cooperative. Patient is in no acute distress.  Skin: Skin is warm and dry. No rash noted.   Cardiovascular: Normal heart rate noted  Respiratory: Normal respiratory effort, no problems with respiration noted  Abdomen: Soft, gravid, appropriate for gestational age. Pain/Pressure: Present     Pelvic:  Cervical exam deferred        Extremities: Normal range of motion.  Edema: None  Mental Status: Normal mood and affect. Normal behavior. Normal judgment and thought content.   Urinalysis:      Assessment and Plan:  Pregnancy: G1P0 at 5756w3d  1. Supervision of normal first pregnancy, antepartum Rx: - Glucose Tolerance, 2 Hours w/1 Hour - CBC - HIV antibody - RPR  2. Acute midline low back pain without sciatica Rx: - Elastic Bandages & Supports (COMFORT FIT MATERNITY SUPP SM) MISC; Wear as directed.  Dispense: 1 each; Refill: 0  Preterm labor symptoms and general obstetric precautions including but not limited to vaginal bleeding, contractions, leaking of fluid and fetal movement were reviewed in detail with the patient. Please refer to  After Visit Summary for other counseling recommendations.  Return in about 2 weeks (around 11/21/2017) for ROB.   Brock BadHarper, Rahkeem Senft A, MD

## 2017-11-08 LAB — CBC
HEMOGLOBIN: 12 g/dL (ref 11.1–15.9)
Hematocrit: 36 % (ref 34.0–46.6)
MCH: 29.3 pg (ref 26.6–33.0)
MCHC: 33.3 g/dL (ref 31.5–35.7)
MCV: 88 fL (ref 79–97)
Platelets: 270 10*3/uL (ref 150–379)
RBC: 4.1 x10E6/uL (ref 3.77–5.28)
RDW: 13.4 % (ref 12.3–15.4)
WBC: 11.9 10*3/uL — ABNORMAL HIGH (ref 3.4–10.8)

## 2017-11-08 LAB — RPR: RPR: NONREACTIVE

## 2017-11-08 LAB — GLUCOSE TOLERANCE, 2 HOURS W/ 1HR
GLUCOSE, 2 HOUR: 49 mg/dL — AB (ref 65–152)
GLUCOSE, FASTING: 88 mg/dL (ref 65–91)
Glucose, 1 hour: 145 mg/dL (ref 65–179)

## 2017-11-08 LAB — HIV ANTIBODY (ROUTINE TESTING W REFLEX): HIV SCREEN 4TH GENERATION: NONREACTIVE

## 2017-11-17 ENCOUNTER — Encounter: Payer: Self-pay | Admitting: Obstetrics

## 2017-11-21 ENCOUNTER — Encounter: Payer: Self-pay | Admitting: Obstetrics

## 2017-11-21 ENCOUNTER — Ambulatory Visit (INDEPENDENT_AMBULATORY_CARE_PROVIDER_SITE_OTHER): Payer: 59 | Admitting: Obstetrics

## 2017-11-21 VITALS — BP 138/80 | HR 85 | Wt 320.0 lb

## 2017-11-21 DIAGNOSIS — Z3403 Encounter for supervision of normal first pregnancy, third trimester: Secondary | ICD-10-CM

## 2017-11-21 DIAGNOSIS — O99213 Obesity complicating pregnancy, third trimester: Secondary | ICD-10-CM

## 2017-11-21 DIAGNOSIS — Z3689 Encounter for other specified antenatal screening: Secondary | ICD-10-CM

## 2017-11-21 DIAGNOSIS — Z3400 Encounter for supervision of normal first pregnancy, unspecified trimester: Secondary | ICD-10-CM

## 2017-11-21 DIAGNOSIS — O99210 Obesity complicating pregnancy, unspecified trimester: Secondary | ICD-10-CM

## 2017-11-21 DIAGNOSIS — E669 Obesity, unspecified: Secondary | ICD-10-CM

## 2017-11-21 NOTE — Progress Notes (Signed)
Subjective:  Brooke Bryant is a 32 y.o. G1P0 at 2041w3d being seen today for ongoing prenatal care.  She is currently monitored for the following issues for this high-risk pregnancy and has Supervision of normal pregnancy, antepartum and BMI 45.0-49.9, adult (HCC) on their problem list.  Patient reports no complaints.  Contractions: Not present. Vag. Bleeding: None.  Movement: Present. Denies leaking of fluid.   The following portions of the patient's history were reviewed and updated as appropriate: allergies, current medications, past family history, past medical history, past social history, past surgical history and problem list. Problem list updated.  Objective:   Vitals:   11/21/17 1509  BP: 138/80  Pulse: 85  Weight: (!) 320 lb (145.2 kg)    Fetal Status: Fetal Heart Rate (bpm): 150   Movement: Present     General:  Alert, oriented and cooperative. Patient is in no acute distress.  Skin: Skin is warm and dry. No rash noted.   Cardiovascular: Normal heart rate noted  Respiratory: Normal respiratory effort, no problems with respiration noted  Abdomen: Soft, gravid, appropriate for gestational age. Pain/Pressure: Present     Pelvic:  Cervical exam deferred        Extremities: Normal range of motion.  Edema: Trace  Mental Status: Normal mood and affect. Normal behavior. Normal judgment and thought content.   Urinalysis:      Assessment and Plan:  Pregnancy: G1P0 at 3341w3d  1. Supervision of normal first pregnancy, antepartum - doing well  2. Encounter for ultrasound to assess interval growth of fetus Rx: - US MFM OB FOLLOW UP; Future  3. Obesity affecting pregnancy, antepartum   Preterm labor symptoms and general obstetric precautions including but not limited to vaginal bleeding, contractions, leaking of fluid and fetal movement were reviewed in detail with the patient. Please refer to After Visit Summary for other counseling recommendations.  Return in about 2  weeks (around 12/05/2017) for ROB.   Brock BadHarper, Marvelous Woolford A, MD

## 2017-11-29 ENCOUNTER — Ambulatory Visit (HOSPITAL_COMMUNITY)
Admission: RE | Admit: 2017-11-29 | Discharge: 2017-11-29 | Disposition: A | Discharge status: Home / Self Care | Payer: 59 | Source: Ambulatory Visit | Attending: Obstetrics | Admitting: Obstetrics

## 2017-11-29 ENCOUNTER — Other Ambulatory Visit: Payer: Self-pay | Admitting: Obstetrics

## 2017-11-29 DIAGNOSIS — Z362 Encounter for other antenatal screening follow-up: Secondary | ICD-10-CM | POA: Insufficient documentation

## 2017-11-29 DIAGNOSIS — Z3A30 30 weeks gestation of pregnancy: Secondary | ICD-10-CM | POA: Diagnosis not present

## 2017-11-29 DIAGNOSIS — Z3689 Encounter for other specified antenatal screening: Secondary | ICD-10-CM

## 2017-11-29 DIAGNOSIS — O99213 Obesity complicating pregnancy, third trimester: Secondary | ICD-10-CM | POA: Insufficient documentation

## 2017-12-05 ENCOUNTER — Encounter: Payer: Self-pay | Admitting: Obstetrics

## 2017-12-05 ENCOUNTER — Ambulatory Visit (INDEPENDENT_AMBULATORY_CARE_PROVIDER_SITE_OTHER): Payer: 59 | Admitting: Obstetrics

## 2017-12-05 VITALS — BP 123/79 | HR 88 | Wt 320.0 lb

## 2017-12-05 DIAGNOSIS — O99213 Obesity complicating pregnancy, third trimester: Secondary | ICD-10-CM

## 2017-12-05 DIAGNOSIS — Z3403 Encounter for supervision of normal first pregnancy, third trimester: Secondary | ICD-10-CM

## 2017-12-05 DIAGNOSIS — Z3400 Encounter for supervision of normal first pregnancy, unspecified trimester: Secondary | ICD-10-CM

## 2017-12-05 DIAGNOSIS — O99210 Obesity complicating pregnancy, unspecified trimester: Secondary | ICD-10-CM

## 2017-12-05 NOTE — Progress Notes (Signed)
Subjective:  Brooke Bryant is a 32 y.o. G1P0 at [redacted]w[redacted]d being seen today for ongoing prenatal care.  She is currently monitored for the following issues for this low-risk pregnancy and has Supervision of normal pregnancy, antepartum and BMI 45.0-49.9, adult (HCC) on their problem list.  Patient reports no complaints.  Contractions: Not present. Vag. Bleeding: None.  Movement: Present. Denies leaking of fluid.   The following portions of the patient's history were reviewed and updated as appropriate: allergies, current medications, past family history, past medical history, past social history, past surgical history and problem list. Problem list updated.  Objective:   Vitals:   12/05/17 1133  BP: 123/79  Pulse: 88  Weight: (!) 320 lb (145.2 kg)    Fetal Status: Fetal Heart Rate (bpm): 150   Movement: Present     General:  Alert, oriented and cooperative. Patient is in no acute distress.  Skin: Skin is warm and dry. No rash noted.   Cardiovascular: Normal heart rate noted  Respiratory: Normal respiratory effort, no problems with respiration noted  Abdomen: Soft, gravid, appropriate for gestational age. Pain/Pressure: Absent     Pelvic:  Cervical exam deferred        Extremities: Normal range of motion.  Edema: Trace  Mental Status: Normal mood and affect. Normal behavior. Normal judgment and thought content.   Urinalysis:      Assessment and Plan:  Pregnancy: G1P0 at [redacted]w[redacted]d  1. Supervision of normal first pregnancy, antepartum - doing well  2. Obesity affecting pregnancy, antepartum   Preterm labor symptoms and general obstetric precautions including but not limited to vaginal bleeding, contractions, leaking of fluid and fetal movement were reviewed in detail with the patient. Please refer to After Visit Summary for other counseling recommendations.  Return in about 2 weeks (around 12/19/2017) for ROB.   Brock Bad, MD

## 2017-12-19 ENCOUNTER — Ambulatory Visit (INDEPENDENT_AMBULATORY_CARE_PROVIDER_SITE_OTHER): Payer: 59 | Admitting: Obstetrics

## 2017-12-19 ENCOUNTER — Encounter: Payer: Self-pay | Admitting: Obstetrics

## 2017-12-19 VITALS — BP 132/82 | HR 78 | Wt 325.7 lb

## 2017-12-19 DIAGNOSIS — O99210 Obesity complicating pregnancy, unspecified trimester: Secondary | ICD-10-CM

## 2017-12-19 DIAGNOSIS — O99213 Obesity complicating pregnancy, third trimester: Secondary | ICD-10-CM

## 2017-12-19 DIAGNOSIS — Z3400 Encounter for supervision of normal first pregnancy, unspecified trimester: Secondary | ICD-10-CM

## 2017-12-19 DIAGNOSIS — Z3403 Encounter for supervision of normal first pregnancy, third trimester: Secondary | ICD-10-CM

## 2017-12-20 ENCOUNTER — Encounter: Payer: Self-pay | Admitting: Obstetrics

## 2017-12-20 NOTE — Progress Notes (Signed)
Subjective:  Brooke Bryant is a 31 y.o. G1P0 at [redacted]w[redacted]d being seen today for ongoing prenatal care.  She is currently monitored for the following issues for this low-risk pregnancy and has Supervision of normal pregnancy, antepartum and BMI 45.0-49.9, adult (HCC) on their problem list.  Patient reports no complaints.  Contractions: Irregular. Vag. Bleeding: None.  Movement: Present. Denies leaking of fluid.   The following portions of the patient's history were reviewed and updated as appropriate: allergies, current medications, past family history, past medical history, past social history, past surgical history and problem list. Problem list updated.  Objective:   Vitals:   12/19/17 1504  BP: 132/82  Pulse: 78  Weight: (!) 325 lb 11.2 oz (147.7 kg)    Fetal Status: Fetal Heart Rate (bpm): 150   Movement: Present     General:  Alert, oriented and cooperative. Patient is in no acute distress.  Skin: Skin is warm and dry. No rash noted.   Cardiovascular: Normal heart rate noted  Respiratory: Normal respiratory effort, no problems with respiration noted  Abdomen: Soft, gravid, appropriate for gestational age. Pain/Pressure: Present     Pelvic:  Cervical exam deferred        Extremities: Normal range of motion.  Edema: Trace  Mental Status: Normal mood and affect. Normal behavior. Normal judgment and thought content.   Urinalysis:      Assessment and Plan:  Pregnancy: G1P0 at [redacted]w[redacted]d  There are no diagnoses linked to this encounter. Preterm labor symptoms and general obstetric precautions including but not limited to vaginal bleeding, contractions, leaking of fluid and fetal movement were reviewed in detail with the patient. Please refer to After Visit Summary for other counseling recommendations.  Return in about 2 weeks (around 01/02/2018) for ROB.   Brock Bad, MD

## 2017-12-23 ENCOUNTER — Telehealth: Payer: Self-pay

## 2017-12-23 NOTE — Telephone Encounter (Signed)
Return call to pt regarding message pt not ava. vm is full.

## 2018-01-02 ENCOUNTER — Ambulatory Visit: Payer: 59 | Admitting: Obstetrics

## 2018-01-02 ENCOUNTER — Encounter: Payer: Self-pay | Admitting: Obstetrics

## 2018-01-02 VITALS — BP 135/81 | HR 90 | Wt 332.0 lb

## 2018-01-02 DIAGNOSIS — G8929 Other chronic pain: Secondary | ICD-10-CM

## 2018-01-02 DIAGNOSIS — M545 Low back pain: Secondary | ICD-10-CM

## 2018-01-02 DIAGNOSIS — Z3400 Encounter for supervision of normal first pregnancy, unspecified trimester: Secondary | ICD-10-CM

## 2018-01-02 DIAGNOSIS — O99210 Obesity complicating pregnancy, unspecified trimester: Secondary | ICD-10-CM

## 2018-01-02 MED ORDER — CYCLOBENZAPRINE HCL 10 MG PO TABS: 10.0000 mg | ORAL_TABLET | Freq: Three times a day (TID) | ORAL | 0 refills | Status: DC | PRN

## 2018-01-02 NOTE — Progress Notes (Signed)
Pt states she fell 2 weeks ago and did not go to [facility] for an evaluation Pt notes  +FM  Pt may have lost Mucous plug last week. Increased back and hip pain

## 2018-01-02 NOTE — Patient Instructions (Signed)
Group B Streptococcus Infection During Pregnancy Group B Streptococcus (GBS) is a type of bacteria (Streptococcus agalactiae) that is often found in healthy people, commonly in the rectum, vagina, and intestines. In people who are healthy and not pregnant, the bacteria rarely cause serious illness or complications. However, women who test positive for GBS during pregnancy can pass the bacteria to their baby during childbirth, which can cause serious infection in the baby after birth. Women with GBS may also have infections during their pregnancy or immediately after childbirth, such as such as urinary tract infections (UTIs) or infections of the uterus (uterine infections). Having GBS also increases a woman's risk of complications during pregnancy, such as early (preterm) labor or delivery, miscarriage, or stillbirth. Routine testing (screening) for GBS is recommended for all pregnant women. What increases the risk? You may have a higher risk for GBS infection during pregnancy if you had one during a past pregnancy. What are the signs or symptoms? In most cases, GBS infection does not cause symptoms in pregnant women. Signs and symptoms of a possible GBS-related infection may include:  Labor starting before the 37th week of pregnancy.  A UTI or bladder infection, which may cause: ? Fever. ? Pain or burning during urination. ? Frequent urination.  Fever during labor, along with: ? Bad-smelling discharge. ? Uterine tenderness. ? Rapid heartbeat in the mother, baby, or both.  Rare but serious symptoms of a possible GBS-related infection in women include:  Blood infection (septicemia). This may cause fever, chills, or confusion.  Lung infection (pneumonia). This may cause fever, chills, cough, rapid breathing, difficulty breathing, or chest pain.  Bone, joint, skin, or soft tissue infection.  How is this diagnosed? You may be screened for GBS between week 35 and week 37 of your pregnancy. If  you have symptoms of preterm labor, you may be screened earlier. This condition is diagnosed based on lab test results from:  A swab of fluid from the vagina and rectum.  A urine sample.  How is this treated? This condition is treated with antibiotic medicine. When you go into labor, or as soon as your water breaks (your membranes rupture), you will be given antibiotics through an IV tube. Antibiotics will continue until after you give birth. If you are having a cesarean delivery, you do not need antibiotics unless your membranes have already ruptured. Follow these instructions at home:  Take over-the-counter and prescription medicines only as told by your health care provider.  Take your antibiotic medicine as told by your health care provider. Do not stop taking the antibiotic even if you start to feel better.  Keep all pre-birth (prenatal) visits and follow-up visits as told by your health care provider. This is important. Contact a health care provider if:  You have pain or burning when you urinate.  You have to urinate frequently.  You have a fever or chills.  You develop a bad-smelling vaginal discharge. Get help right away if:  Your membranes rupture.  You go into labor.  You have severe pain in your abdomen.  You have difficulty breathing.  You have chest pain. This information is not intended to replace advice given to you by your health care provider. Make sure you discuss any questions you have with your health care provider. Document Released: 10/26/2007 Document Revised: 02/13/2016 Document Reviewed: 02/12/2016 Elsevier Interactive Patient Education  2018 [employer]  

## 2018-01-03 ENCOUNTER — Encounter: Payer: Self-pay | Admitting: Obstetrics

## 2018-01-03 NOTE — Progress Notes (Addendum)
Subjective:  Brooke Bryant is a 32 y.o. G1P0 at 2766w4d being seen today for ongoing prenatal care.  She is currently monitored for the following issues for this low-risk pregnancy and has Supervision of normal pregnancy, antepartum and BMI 45.0-49.9, adult (HCC) on their problem list.  Patient reports backache.  Contractions: Irritability. Vag. Bleeding: None.  Movement: Present. Denies leaking of fluid.   The following portions of the patient's history were reviewed and updated as appropriate: allergies, current medications, past family history, past medical history, past social history, past surgical history and problem list. Problem list updated.  Objective:   Vitals:   01/02/18 1512  BP: 135/81  Pulse: 90  Weight: (!) 332 lb (150.6 kg)    Fetal Status: Fetal Heart Rate (bpm): 150   Movement: Present     General:  Alert, oriented and cooperative. Patient is in no acute distress.  Skin: Skin is warm and dry. No rash noted.   Cardiovascular: Normal heart rate noted  Respiratory: Normal respiratory effort, no problems with respiration noted  Abdomen: Soft, gravid, appropriate for gestational age. Pain/Pressure: Present     Pelvic:  Cervical exam deferred        Extremities: Normal range of motion.  Edema: Trace  Mental Status: Normal mood and affect. Normal behavior. Normal judgment and thought content.   Urinalysis:      Assessment and Plan:  Pregnancy: G1P0 at 2466w4d  1. Supervision of normal first pregnancy, antepartum  2. Chronic midline low back pain without sciatica Rx: - cyclobenzaprine (FLEXERIL) 10 MG tablet; Take 1 tablet (10 mg total) by mouth every 8 (eight) hours as needed for muscle spasms.  Dispense: 84 tablet; Refill: 0 - PATIENT TO START MATERNITY LEAVE AS OF 01-02-2018 UNTIL ~ 6 WEEKS AFTER DELIVERY  3. Obesity affecting pregnancy, antepartum   Preterm labor symptoms and general obstetric precautions including but not limited to vaginal bleeding,  contractions, leaking of fluid and fetal movement were reviewed in detail with the patient. Please refer to After Visit Summary for other counseling recommendations.  Return in about 1 week (around 01/09/2018) for ROB.   Brock BadHarper, Song Garris A, MD

## 2018-01-09 ENCOUNTER — Ambulatory Visit (INDEPENDENT_AMBULATORY_CARE_PROVIDER_SITE_OTHER): Payer: 59 | Admitting: Obstetrics

## 2018-01-09 ENCOUNTER — Encounter: Payer: Self-pay | Admitting: Obstetrics

## 2018-01-09 VITALS — BP 122/88 | HR 85 | Wt 335.8 lb

## 2018-01-09 DIAGNOSIS — O99213 Obesity complicating pregnancy, third trimester: Secondary | ICD-10-CM

## 2018-01-09 DIAGNOSIS — O99210 Obesity complicating pregnancy, unspecified trimester: Secondary | ICD-10-CM

## 2018-01-09 DIAGNOSIS — Z113 Encounter for screening for infections with a predominantly sexual mode of transmission: Secondary | ICD-10-CM

## 2018-01-09 DIAGNOSIS — Z3400 Encounter for supervision of normal first pregnancy, unspecified trimester: Secondary | ICD-10-CM

## 2018-01-09 DIAGNOSIS — Z3403 Encounter for supervision of normal first pregnancy, third trimester: Secondary | ICD-10-CM

## 2018-01-09 DIAGNOSIS — E669 Obesity, unspecified: Secondary | ICD-10-CM

## 2018-01-09 LAB — OB RESULTS CONSOLE GBS: STREP GROUP B AG: NEGATIVE

## 2018-01-09 NOTE — Progress Notes (Signed)
Pt states she fell on her back side last Thursday.  GBS due today.

## 2018-01-09 NOTE — Progress Notes (Signed)
Subjective:  Brooke Bryant is a 32 y.o. G1P0 at 1830w3d being seen today for ongoing prenatal care.  She is currently monitored for the following issues for this low-risk pregnancy and has Supervision of normal pregnancy, antepartum and BMI 45.0-49.9, adult (HCC) on their problem list.  Patient reports no complaints.  Contractions: Irritability. Vag. Bleeding: None.  Movement: Present. Denies leaking of fluid.   The following portions of the patient's history were reviewed and updated as appropriate: allergies, current medications, past family history, past medical history, past social history, past surgical history and problem list. Problem list updated.  Objective:   Vitals:   01/09/18 0847  Weight: (!) 335 lb 12.8 oz (152.3 kg)    Fetal Status:     Movement: Present     General:  Alert, oriented and cooperative. Patient is in no acute distress.  Skin: Skin is warm and dry. No rash noted.   Cardiovascular: Normal heart rate noted  Respiratory: Normal respiratory effort, no problems with respiration noted  Abdomen: Soft, gravid, appropriate for gestational age. Pain/Pressure: Present     Pelvic:  Cervical exam deferred        Extremities: Normal range of motion.  Edema: Trace  Mental Status: Normal mood and affect. Normal behavior. Normal judgment and thought content.   Urinalysis:      Assessment and Plan:  Pregnancy: G1P0 at 7030w3d  1. Supervision of normal first pregnancy, antepartum Rx: - Strep Gp B NAA - GC/Chlamydia probe amp ([place]  2. Obesity affecting pregnancy, antepartum   Term labor symptoms and general obstetric precautions including but not limited to vaginal bleeding, contractions, leaking of fluid and fetal movement were reviewed in detail with the patient. Please refer to After Visit Summary for other counseling recommendations.  Return in about 1 week (around 01/16/2018) for ROB.   Brock BadHarper, Taite Schoeppner A, MD

## 2018-01-10 LAB — GC/CHLAMYDIA PROBE AMP (~~LOC~~) NOT AT ARMC
Chlamydia: NEGATIVE
NEISSERIA GONORRHEA: NEGATIVE

## 2018-01-11 LAB — STREP GP B NAA: Strep Gp B NAA: NEGATIVE

## 2018-01-17 ENCOUNTER — Encounter: Payer: Self-pay | Admitting: Obstetrics and Gynecology

## 2018-01-17 ENCOUNTER — Ambulatory Visit (INDEPENDENT_AMBULATORY_CARE_PROVIDER_SITE_OTHER): Payer: 59 | Admitting: Obstetrics and Gynecology

## 2018-01-17 ENCOUNTER — Telehealth: Payer: Self-pay

## 2018-01-17 VITALS — BP 127/86 | HR 83 | Wt 340.6 lb

## 2018-01-17 DIAGNOSIS — Z6841 Body Mass Index (BMI) 40.0 and over, adult: Secondary | ICD-10-CM

## 2018-01-17 DIAGNOSIS — Z3403 Encounter for supervision of normal first pregnancy, third trimester: Secondary | ICD-10-CM

## 2018-01-17 DIAGNOSIS — Z3400 Encounter for supervision of normal first pregnancy, unspecified trimester: Secondary | ICD-10-CM

## 2018-01-17 NOTE — Progress Notes (Signed)
Room 2 Pt would like to have her cervix checked today, says she has been having strong BH contractions.

## 2018-01-17 NOTE — Progress Notes (Signed)
   PRENATAL VISIT NOTE  Subjective:  Brooke Bryant is a 32 y.o. G1P0 at 2246w4d being seen today for ongoing prenatal care.  She is currently monitored for the following issues for this low-risk pregnancy and has Supervision of normal pregnancy, antepartum and BMI 45.0-49.9, adult (HCC) on their problem list.  Patient reports occasional contractions.  Contractions: Irregular. Vag. Bleeding: None.  Movement: Present. Denies leaking of fluid.   The following portions of the patient's history were reviewed and updated as appropriate: allergies, current medications, past family history, past medical history, past social history, past surgical history and problem list. Problem list updated.  Objective:   Vitals:   01/17/18 0835  BP: 127/86  Pulse: 83  Weight: (!) 340 lb 9.6 oz (154.5 kg)    Fetal Status: Fetal Heart Rate (bpm): 145   Movement: Present     General:  Alert, oriented and cooperative. Patient is in no acute distress.  Skin: Skin is warm and dry. No rash noted.   Cardiovascular: Normal heart rate noted  Respiratory: Normal respiratory effort, no problems with respiration noted  Abdomen: Soft, gravid, appropriate for gestational age.  Pain/Pressure: Present     Pelvic: Cervical exam deferred        Extremities: Normal range of motion.  Edema: Trace  Mental Status: Normal mood and affect. Normal behavior. Normal judgment and thought content.   Assessment and Plan:  Pregnancy: G1P0 at 8146w4d  1. Supervision of normal first pregnancy, antepartum Reviewed options for birth control including oral contraceptive pills (combination and progesterone only), NuvaRing, Depo-Provera, Nexplanon, IUDs (copper and levonorgestrol). Thoroughly reviewed risks/benefits/side effects of each. Answered all questions. Patient with h/o of n/a and opts for LngIUD.  2. BMI 45.0-49.9, adult (HCC)  Term labor symptoms and general obstetric precautions including but not limited to vaginal bleeding,  contractions, leaking of fluid and fetal movement were reviewed in detail with the patient. Please refer to After Visit Summary for other counseling recommendations.  Return in about 1 week (around 01/24/2018) for OB visit.  No future appointments.  Conan BowensKelly M Wei Newbrough, MD

## 2018-01-17 NOTE — Telephone Encounter (Signed)
Turn call to [employer] needing info regarding pt Short Term Disability. Also faxed last 3 office notes.

## 2018-01-17 NOTE — Patient Instructions (Addendum)
Contraception Choices Contraception, also called birth control, refers to methods or devices that prevent pregnancy. Hormonal methods Contraceptive implant A contraceptive implant is a thin, plastic tube that contains a hormone. It is inserted into the upper part of the arm. It can remain in place for up to 3 years. Progestin-only injections Progestin-only injections are injections of progestin, a synthetic form of the hormone progesterone. They are given every 3 months by a health care provider. Birth control pills Birth control pills are pills that contain hormones that prevent pregnancy. They must be taken once a day, preferably at the same time each day. Birth control patch The birth control patch contains hormones that prevent pregnancy. It is placed on the skin and must be changed once a week for three weeks and removed on the fourth week. A prescription is needed to use this method of contraception. Vaginal ring A vaginal ring contains hormones that prevent pregnancy. It is placed in the vagina for three weeks and removed on the fourth week. After that, the process is repeated with a new ring. A prescription is needed to use this method of contraception. Emergency contraceptive Emergency contraceptives prevent pregnancy after unprotected sex. They come in pill form and can be taken up to 5 days after sex. They work best the sooner they are taken after having sex. Most emergency contraceptives are available without a prescription. This method should not be used as your only form of birth control. Barrier methods Female condom A female condom is a thin sheath that is worn over the penis during sex. Condoms keep sperm from going inside a woman's body. They can be used with a spermicide to increase their effectiveness. They should be disposed after a single use. Female condom A female condom is a soft, loose-fitting sheath that is put into the vagina before sex. The condom keeps sperm from going  inside a woman's body. They should be disposed after a single use.  Intrauterine contraception Intrauterine device (IUD) An IUD is a T-shaped device that is put in a woman's uterus. There are two types:  Hormone IUD.This type contains progestin, a synthetic form of the hormone progesterone. This type can stay in place for 3-5 years.  Copper IUD.This type is wrapped in copper wire. It can stay in place for 10 years.  Permanent methods of contraception Female tubal ligation In this method, a woman's fallopian tubes are sealed, tied, or blocked during surgery to prevent eggs from traveling to the uterus.  Female sterilization This is a procedure to tie off the tubes that carry sperm (vasectomy). After the procedure, the man can still ejaculate fluid (semen).  Summary  Contraception, also called birth control, means methods or devices that prevent pregnancy.  Hormonal methods of contraception include implants, injections, pills, patches, vaginal rings, and emergency contraceptives.  Barrier methods of contraception can include female condoms, female condoms, diaphragms, cervical caps, sponges, and spermicides.  There are two types of IUDs (intrauterine devices). An IUD can be put in a woman's uterus to prevent pregnancy for 3-5 years.  Permanent sterilization can be done through a procedure for males, females, or both. This information is not intended to replace advice given to you by your health care provider. Make sure you discuss any questions you have with your health care provider. Document Released: 07/19/2005 Document Revised: 08/21/2016 Document Reviewed: 08/21/2016 Elsevier Interactive Patient Education  2018 [employer] Etonogestrel implant What is this medicine? ETONOGESTREL (et oh noe JES trel) is a contraceptive (birth control)   device. It is used to prevent pregnancy. It can be used for up to 3 years. This medicine may be used for other purposes; ask your health care  provider or pharmacist if you have questions. COMMON BRAND NAME(S): Implanon, Nexplanon What should I tell my health care provider before I take this medicine? They need to know if you have any of these conditions: -abnormal vaginal bleeding -blood vessel disease or blood clots -cancer of the breast, cervix, or liver -depression -diabetes -gallbladder disease -headaches -heart disease or recent heart attack -high blood pressure -high cholesterol -kidney disease -liver disease -renal disease -seizures -tobacco smoker -an unusual or allergic reaction to etonogestrel, other hormones, anesthetics or antiseptics, medicines, foods, dyes, or preservatives -pregnant or trying to get pregnant -breast-feeding How should I use this medicine? This device is inserted just under the skin on the inner side of your upper arm by a health care professional. Talk to your pediatrician regarding the use of this medicine in children. Special care may be needed. Overdosage: If you think you have taken too much of this medicine contact a poison control center or emergency room at once. NOTE: This medicine is only for you. Do not share this medicine with others. What if I miss a dose? This does not apply. What may interact with this medicine? Do not take this medicine with any of the following medications: -amprenavir -bosentan -fosamprenavir This medicine may also interact with the following medications: -barbiturate medicines for inducing sleep or treating seizures -certain medicines for fungal infections like ketoconazole and itraconazole -grapefruit juice -griseofulvin -medicines to treat seizures like carbamazepine, felbamate, oxcarbazepine, phenytoin, topiramate -modafinil -phenylbutazone -rifampin -rufinamide -some medicines to treat HIV infection like atazanavir, indinavir, lopinavir, nelfinavir, tipranavir, ritonavir -St. John's wort This list may not describe all possible interactions.  Give your health care provider a list of all the medicines, herbs, non-prescription drugs, or dietary supplements you use. Also tell them if you smoke, drink alcohol, or use illegal drugs. Some items may interact with your medicine. What should I watch for while using this medicine? This product does not protect you against HIV infection (AIDS) or other sexually transmitted diseases. You should be able to feel the implant by pressing your fingertips over the skin where it was inserted. Contact your doctor if you cannot feel the implant, and use a non-hormonal birth control method (such as condoms) until your doctor confirms that the implant is in place. If you feel that the implant may have broken or become bent while in your arm, contact your healthcare provider. What side effects may I notice from receiving this medicine? Side effects that you should report to your doctor or health care professional as soon as possible: -allergic reactions like skin rash, itching or hives, swelling of the face, lips, or tongue -breast lumps -changes in emotions or moods -depressed mood -heavy or prolonged menstrual bleeding -pain, irritation, swelling, or bruising at the insertion site -scar at site of insertion -signs of infection at the insertion site such as fever, and skin redness, pain or discharge -signs of pregnancy -signs and symptoms of a blood clot such as breathing problems; changes in vision; chest pain; severe, sudden headache; pain, swelling, warmth in the leg; trouble speaking; sudden numbness or weakness of the face, arm or leg -signs and symptoms of liver injury like dark yellow or brown urine; general ill feeling or flu-like symptoms; light-colored stools; loss of appetite; nausea; right upper belly pain; unusually weak or tired; yellowing of the eyes   or skin -unusual vaginal bleeding, discharge -signs and symptoms of a stroke like changes in vision; confusion; trouble speaking or understanding;  severe headaches; sudden numbness or weakness of the face, arm or leg; trouble walking; dizziness; loss of balance or coordination Side effects that usually do not require medical attention (report to your doctor or health care professional if they continue or are bothersome): -acne -back pain -breast pain -changes in weight -dizziness -general ill feeling or flu-like symptoms -headache -irregular menstrual bleeding -nausea -sore throat -vaginal irritation or inflammation This list may not describe all possible side effects. Call your doctor for medical advice about side effects. You may report side effects to FDA at 1-800-FDA-1088. Where should I keep my medicine? This drug is given in a hospital or clinic and will not be stored at home. NOTE: This sheet is a summary. It may not cover all possible information. If you have questions about this medicine, talk to your doctor, pharmacist, or health care provider.  2018 Elsevier/Gold Standard (2016-02-05 11:19:22)  Intrauterine Device Information An intrauterine device (IUD) is inserted into your uterus to prevent pregnancy. There are two types of IUDs available:  Copper IUD-This type of IUD is wrapped in copper wire and is placed inside the uterus. Copper makes the uterus and fallopian tubes produce a fluid that kills sperm. The copper IUD can stay in place for 10 years.  Hormone IUD-This type of IUD contains the hormone progestin (synthetic progesterone). The hormone thickens the cervical mucus and prevents sperm from entering the uterus. It also thins the uterine lining to prevent implantation of a fertilized egg. The hormone can weaken or kill the sperm that get into the uterus. One type of hormone IUD can stay in place for 5 years, and another type can stay in place for 3 years.  Your health care provider will make sure you are a good candidate for a contraceptive IUD. Discuss with your health care provider the possible side  effects. Advantages of an intrauterine device  IUDs are highly effective, reversible, long acting, and low maintenance.  There are no estrogen-related side effects.  An IUD can be used when breastfeeding.  IUDs are not associated with weight gain.  The copper IUD works immediately after insertion.  The hormone IUD works right away if inserted within 7 days of your period starting. You will need to use a backup method of birth control for 7 days if the hormone IUD is inserted at any other time in your cycle.  The copper IUD does not interfere with your female hormones.  The hormone IUD can make heavy menstrual periods lighter and decrease cramping.  The hormone IUD can be used for 3 or 5 years.  The copper IUD can be used for 10 years. Disadvantages of an intrauterine device  The hormone IUD can be associated with irregular bleeding patterns.  The copper IUD can make your menstrual flow heavier and more painful.  You may experience cramping and vaginal bleeding after insertion. This information is not intended to replace advice given to you by your health care provider. Make sure you discuss any questions you have with your health care provider. Document Released: 06/22/2004 Document Revised: 12/25/2015 Document Reviewed: 01/07/2013 Elsevier Interactive Patient Education  2017 Elsevier Inc.     Places to have your son circumcised:    [facility] [phone] $[phone] while you are in hospital  [facility] [phone] $244 by 4 wks  [facility] [phone] $[phone] by 2 wks  [facility] [phone] $  250 by 7 days [facility] [phone] $[phone] by 4 wks  These prices sometimes change but are roughly what you can expect to pay. Please call and confirm pricing.   Circumcision is considered an elective/non-medically necessary  procedure. There are many reasons parents decide to have their sons circumsized. During the first year of life circumcised males have a reduced risk of urinary tract infections but after this year the rates between circumcised males and uncircumcised males are the same.  It is safe to have your son circumcised outside of the hospital and the places above perform them regularly.   Deciding about Circumcision in Baby Boys  (Up-to-date The Basics)  What is circumcision?  Circumcision is a surgery that removes the skin that covers the tip of the penis, called the "foreskin" Circumcision is usually done when a boy is between 1 and 10 days old. In the [place], circumcision is common. In some other countries, fewer boys are circumcised. Circumcision is a common tradition in some religions.  Should I have my baby boy circumcised?  There is no easy answer. Circumcision has some benefits. But it also has risks. After talking with your doctor, you will have to decide for yourself what is right for your family.  What are the benefits of circumcision?  Circumcised boys seem to have slightly lower rates of: ?Urinary tract infections ?Swelling of the opening at the tip of the penis Circumcised men seem to have slightly lower rates of: ?Urinary tract infections ?Swelling of the opening at the tip of the penis ?Penis cancer ?HIV and other infections that you catch during sex ?Cervical cancer in the women they have sex with Even so, in the [place], the risks of these problems are small - even in boys and men who have not been circumcised. Plus, boys and men who are not circumcised can reduce these extra risks by: ?Cleaning their penis well ?Using condoms during sex  What are the risks of circumcision?  Risks include: ?Bleeding or infection from the surgery ?Damage to or amputation of the penis ?A chance that the doctor will cut off too much or not enough of the foreskin ?A chance that  sex won't feel as good later in life Only about 1 out of every 200 circumcisions leads to problems. There is also a chance that your health insurance won't pay for circumcision.  How is circumcision done in baby boys?  First, the baby gets medicine for pain relief. This might be a cream on the skin or a shot into the base of the penis. Next, the doctor cleans the baby's penis well. Then he or she uses special tools to cut off the foreskin. Finally, the doctor wraps a bandage (called gauze) around the baby's penis. If you have your baby circumcised, his doctor or nurse will give you instructions on how to care for him after the surgery. It is important that you follow those instructions carefully.  

## 2018-01-24 ENCOUNTER — Encounter: Payer: Self-pay | Admitting: Obstetrics and Gynecology

## 2018-01-24 ENCOUNTER — Ambulatory Visit (INDEPENDENT_AMBULATORY_CARE_PROVIDER_SITE_OTHER): Payer: 59 | Admitting: Obstetrics and Gynecology

## 2018-01-24 VITALS — BP 132/83 | HR 80 | Wt 344.0 lb

## 2018-01-24 DIAGNOSIS — Z6841 Body Mass Index (BMI) 40.0 and over, adult: Secondary | ICD-10-CM

## 2018-01-24 DIAGNOSIS — Z3400 Encounter for supervision of normal first pregnancy, unspecified trimester: Secondary | ICD-10-CM

## 2018-01-24 DIAGNOSIS — Z3403 Encounter for supervision of normal first pregnancy, third trimester: Secondary | ICD-10-CM

## 2018-01-24 NOTE — Progress Notes (Signed)
   PRENATAL VISIT NOTE  Subjective:  Brooke Bryant is a 32 y.o. G1P0 at 2458w4d being seen today for ongoing prenatal care.  She is currently monitored for the following issues for this low-risk pregnancy and has Supervision of normal pregnancy, antepartum and BMI 45.0-49.9, adult (HCC) on their problem list.  Patient reports no complaints.  Contractions: Not present. Vag. Bleeding: None.  Movement: Present. Denies leaking of fluid.   The following portions of the patient's history were reviewed and updated as appropriate: allergies, current medications, past family history, past medical history, past social history, past surgical history and problem list. Problem list updated.  Objective:   Vitals:   01/24/18 0949  BP: 132/83  Pulse: 80  Weight: (!) 344 lb (156 kg)    Fetal Status: Fetal Heart Rate (bpm): 138   Movement: Present     General:  Alert, oriented and cooperative. Patient is in no acute distress.  Skin: Skin is warm and dry. No rash noted.   Cardiovascular: Normal heart rate noted  Respiratory: Normal respiratory effort, no problems with respiration noted  Abdomen: Soft, gravid, appropriate for gestational age.  Pain/Pressure: Present     Pelvic: Cervical exam deferred        Extremities: Normal range of motion.  Edema: Trace  Mental Status: Normal mood and affect. Normal behavior. Normal judgment and thought content.   Pt informed that the ultrasound is considered a limited OB ultrasound and is not intended to be a complete ultrasound exam.  Patient also informed that the ultrasound is not being completed with the intent of assessing for fetal or placental anomalies or any pelvic abnormalities.  Explained that the purpose of today's ultrasound is to assess for  presentation.  Patient acknowledges the purpose of the exam and the limitations of the study.    Bedside US: cephalic  Assessment and Plan:  Pregnancy: G1P0 at 7958w4d  1. Supervision of normal first  pregnancy, antepartum   2. BMI 45.0-49.9, adult [facility](HCC)   Term labor symptoms and general obstetric precautions including but not limited to vaginal bleeding, contractions, leaking of fluid and fetal movement were reviewed in detail with the patient. Please refer to After Visit Summary for other counseling recommendations.  Return in about 1 week (around 01/31/2018) for OB visit.  No future appointments.  Conan BowensKelly M Nekayla Heider, MD

## 2018-02-01 ENCOUNTER — Ambulatory Visit (INDEPENDENT_AMBULATORY_CARE_PROVIDER_SITE_OTHER): Payer: 59 | Admitting: Obstetrics & Gynecology

## 2018-02-01 ENCOUNTER — Encounter (HOSPITAL_COMMUNITY): Payer: Self-pay | Admitting: *Deleted

## 2018-02-01 ENCOUNTER — Telehealth (HOSPITAL_COMMUNITY): Payer: Self-pay | Admitting: *Deleted

## 2018-02-01 DIAGNOSIS — Z3400 Encounter for supervision of normal first pregnancy, unspecified trimester: Secondary | ICD-10-CM

## 2018-02-01 NOTE — Progress Notes (Signed)
   PRENATAL VISIT NOTE  Subjective:  Brooke Bryant is a 32 y.o. G1P0 at 2932w5d being seen today for ongoing prenatal care.  She is currently monitored for the following issues for this low-risk pregnancy and has Supervision of normal pregnancy, antepartum and BMI 45.0-49.9, adult (HCC) on their problem list.  Patient reports no complaints.  Contractions: Irritability. Vag. Bleeding: None.  Movement: Present. Denies leaking of fluid.   The following portions of the patient's history were reviewed and updated as appropriate: allergies, current medications, past family history, past medical history, past social history, past surgical history and problem list. Problem list updated.  Objective:   Vitals:   02/01/18 1046  BP: 124/74  Pulse: 77  Weight: (!) 351 lb (159.2 kg)    Fetal Status: Fetal Heart Rate (bpm): 125   Movement: Present     General:  Alert, oriented and cooperative. Patient is in no acute distress.  Skin: Skin is warm and dry. No rash noted.   Cardiovascular: Normal heart rate noted  Respiratory: Normal respiratory effort, no problems with respiration noted  Abdomen: Soft, gravid, appropriate for gestational age.  Pain/Pressure: Present     Pelvic: Cervical exam performed        Extremities: Normal range of motion.  Edema: Trace  Mental Status: Normal mood and affect. Normal behavior. Normal judgment and thought content.   Assessment and Plan:  Pregnancy: G1P0 at 7432w5d  1. Supervision of normal first pregnancy, antepartum IOL 41 weeks  Term labor symptoms and general obstetric precautions including but not limited to vaginal bleeding, contractions, leaking of fluid and fetal movement were reviewed in detail with the patient. Please refer to After Visit Summary for other counseling recommendations.  Return in about 1 week (around 02/08/2018).  No future appointments.  Scheryl DarterJames Arnold, MD

## 2018-02-01 NOTE — Patient Instructions (Signed)
Vaginal Delivery Vaginal delivery means that you will give birth by pushing your baby out of your birth canal (vagina). A team of health care providers will help you before, during, and after vaginal delivery. Birth experiences are unique for every woman and every pregnancy, and birth experiences vary depending on where you choose to give birth. What should I do to prepare for my baby's birth? Before your baby is born, it is important to talk with your health care provider about:  Your labor and delivery preferences. These may include: ? Medicines that you may be given. ? How you will manage your pain. This might include non-medical pain relief techniques or injectable pain relief such as epidural analgesia. ? How you and your baby will be monitored during labor and delivery. ? Who may be in the labor and delivery room with you. ? Your feelings about surgical delivery of your baby (cesarean delivery, or C-section) if this becomes necessary. ? Your feelings about receiving donated blood through an IV tube (blood transfusion) if this becomes necessary.  Whether you are able: ? To take pictures or videos of the birth. ? To eat during labor and delivery. ? To move around, walk, or change positions during labor and delivery.  What to expect after your baby is born, such as: ? Whether delayed umbilical cord clamping and cutting is offered. ? Who will care for your baby right after birth. ? Medicines or tests that may be recommended for your baby. ? Whether breastfeeding is supported in your hospital or birth center. ? How long you will be in the hospital or birth center.  How any medical conditions you have may affect your baby or your labor and delivery experience.  To prepare for your baby's birth, you should also:  Attend all of your health care visits before delivery (prenatal visits) as recommended by your health care provider. This is important.  Prepare your home for your baby's  arrival. Make sure that you have: ? Diapers. ? Baby clothing. ? Feeding equipment. ? Safe sleeping arrangements for you and your baby.  Install a car seat in your vehicle. Have your car seat checked by a certified car seat installer to make sure that it is installed safely.  Think about who will help you with your new baby at home for at least the first several weeks after delivery.  What can I expect when I arrive at the birth center or hospital? Once you are in labor and have been admitted into the hospital or birth center, your health care provider may:  Review your pregnancy history and any concerns you have.  Insert an IV tube into one of your veins. This is used to give you fluids and medicines.  Check your blood pressure, pulse, temperature, and heart rate (vital signs).  Check whether your bag of water (amniotic sac) has broken (ruptured).  Talk with you about your birth plan and discuss pain control options.  Monitoring Your health care provider may monitor your contractions (uterine monitoring) and your baby's heart rate (fetal monitoring). You may need to be monitored:  Often, but not continuously (intermittently).  All the time or for long periods at a time (continuously). Continuous monitoring may be needed if: ? You are taking certain medicines, such as medicine to relieve pain or make your contractions stronger. ? You have pregnancy or labor complications.  Monitoring may be done by:  Placing a special stethoscope or a handheld monitoring device on your abdomen to   check your baby's heartbeat, and feeling your abdomen for contractions. This method of monitoring does not continuously record your baby's heartbeat or your contractions.  Placing monitors on your abdomen (external monitors) to record your baby's heartbeat and the frequency and length of contractions. You may not have to wear external monitors all the time.  Placing monitors inside of your uterus  (internal monitors) to record your baby's heartbeat and the frequency, length, and strength of your contractions. ? Your health care provider may use internal monitors if he or she needs more information about the strength of your contractions or your baby's heart rate. ? Internal monitors are put in place by passing a thin, flexible wire through your vagina and into your uterus. Depending on the type of monitor, it may remain in your uterus or on your baby's head until birth. ? Your health care provider will discuss the benefits and risks of internal monitoring with you and will ask for your permission before inserting the monitors.  Telemetry. This is a type of continuous monitoring that can be done with external or internal monitors. Instead of having to stay in bed, you are able to move around during telemetry. Ask your health care provider if telemetry is an option for you.  Physical exam Your health care provider may perform a physical exam. This may include:  Checking whether your baby is positioned: ? With the head toward your vagina (head-down). This is most common. ? With the head toward the top of your uterus (head-up or breech). If your baby is in a breech position, your health care provider may try to turn your baby to a head-down position so you can deliver vaginally. If it does not seem that your baby can be born vaginally, your provider may recommend surgery to deliver your baby. In rare cases, you may be able to deliver vaginally if your baby is head-up (breech delivery). ? Lying sideways (transverse). Babies that are lying sideways cannot be delivered vaginally.  Checking your cervix to determine: ? Whether it is thinning out (effacing). ? Whether it is opening up (dilating). ? How low your baby has moved into your birth canal.  What are the three stages of labor and delivery?  Normal labor and delivery is divided into the following three stages: Stage 1  Stage 1 is the  longest stage of labor, and it can last for hours or days. Stage 1 includes: ? Early labor. This is when contractions may be irregular, or regular and mild. Generally, early labor contractions are more than 10 minutes apart. ? Active labor. This is when contractions get longer, more regular, more frequent, and more intense. ? The transition phase. This is when contractions happen very close together, are very intense, and may last longer than during any other part of labor.  Contractions generally feel mild, infrequent, and irregular at first. They get stronger, more frequent (about every 2-3 minutes), and more regular as you progress from early labor through active labor and transition.  Many women progress through stage 1 naturally, but you may need help to continue making progress. If this happens, your health care provider may talk with you about: ? Rupturing your amniotic sac if it has not ruptured yet. ? Giving you medicine to help make your contractions stronger and more frequent.  Stage 1 ends when your cervix is completely dilated to 4 inches (10 cm) and completely effaced. This happens at the end of the transition phase. Stage 2  Once   your cervix is completely effaced and dilated to 4 inches (10 cm), you may start to feel an urge to push. It is common for the body to naturally take a rest before feeling the urge to push, especially if you received an epidural or certain other pain medicines. This rest period may last for up to 1-2 hours, depending on your unique labor experience.  During stage 2, contractions are generally less painful, because pushing helps relieve contraction pain. Instead of contraction pain, you may feel stretching and burning pain, especially when the widest part of your baby's head passes through the vaginal opening (crowning).  Your health care provider will closely monitor your pushing progress and your baby's progress through the vagina during stage 2.  Your  health care provider may massage the area of skin between your vaginal opening and anus (perineum) or apply warm compresses to your perineum. This helps it stretch as the baby's head starts to crown, which can help prevent perineal tearing. ? In some cases, an incision may be made in your perineum (episiotomy) to allow the baby to pass through the vaginal opening. An episiotomy helps to make the opening of the vagina larger to allow more room for the baby to fit through.  It is very important to breathe and focus so your health care provider can control the delivery of your baby's head. Your health care provider may have you decrease the intensity of your pushing, to help prevent perineal tearing.  After delivery of your baby's head, the shoulders and the rest of the body generally deliver very quickly and without difficulty.  Once your baby is delivered, the umbilical cord may be cut right away, or this may be delayed for 1-2 minutes, depending on your baby's health. This may vary among health care providers, hospitals, and birth centers.  If you and your baby are healthy enough, your baby may be placed on your chest or abdomen to help maintain the baby's temperature and to help you bond with each other. Some mothers and babies start breastfeeding at this time. Your health care team will dry your baby and help keep your baby warm during this time.  Your baby may need immediate care if he or she: ? Showed signs of distress during labor. ? Has a medical condition. ? Was born too early (prematurely). ? Had a bowel movement before birth (meconium). ? Shows signs of difficulty transitioning from being inside the uterus to being outside of the uterus. If you are planning to breastfeed, your health care team will help you begin a feeding. Stage 3  The third stage of labor starts immediately after the birth of your baby and ends after you deliver the placenta. The placenta is an organ that develops  during pregnancy to provide oxygen and nutrients to your baby in the womb.  Delivering the placenta may require some pushing, and you may have mild contractions. Breastfeeding can stimulate contractions to help you deliver the placenta.  After the placenta is delivered, your uterus should tighten (contract) and become firm. This helps to stop bleeding in your uterus. To help your uterus contract and to control bleeding, your health care provider may: ? Give you medicine by injection, through an IV tube, by mouth, or through your rectum (rectally). ? Massage your abdomen or perform a vaginal exam to remove any blood clots that are left in your uterus. ? Empty your bladder by placing a thin, flexible tube (catheter) into your bladder. ? Encourage   you to breastfeed your baby. After labor is over, you and your baby will be monitored closely to ensure that you are both healthy until you are ready to go home. Your health care team will teach you how to care for yourself and your baby. This information is not intended to replace advice given to you by your health care provider. Make sure you discuss any questions you have with your health care provider. Document Released: 04/27/2008 Document Revised: 02/06/2016 Document Reviewed: 08/03/2015 Elsevier Interactive Patient Education  2018 [employer]  

## 2018-02-01 NOTE — Telephone Encounter (Signed)
Preadmission screen  

## 2018-02-09 ENCOUNTER — Encounter: Payer: Self-pay | Admitting: Obstetrics and Gynecology

## 2018-02-09 ENCOUNTER — Ambulatory Visit (INDEPENDENT_AMBULATORY_CARE_PROVIDER_SITE_OTHER): Payer: 59 | Admitting: Obstetrics and Gynecology

## 2018-02-09 VITALS — BP 132/87 | HR 80 | Wt 351.1 lb

## 2018-02-09 DIAGNOSIS — Z3400 Encounter for supervision of normal first pregnancy, unspecified trimester: Secondary | ICD-10-CM

## 2018-02-09 DIAGNOSIS — Z8759 Personal history of other complications of pregnancy, childbirth and the puerperium: Secondary | ICD-10-CM

## 2018-02-09 DIAGNOSIS — O480 Post-term pregnancy: Secondary | ICD-10-CM | POA: Diagnosis not present

## 2018-02-09 DIAGNOSIS — Z3403 Encounter for supervision of normal first pregnancy, third trimester: Secondary | ICD-10-CM

## 2018-02-09 NOTE — Progress Notes (Signed)
   PRENATAL VISIT NOTE  Subjective:  Brooke Bryant is a 32 y.o. G1P0 at 6139w6d being seen today for ongoing prenatal care.  She is currently monitored for the following issues for this low-risk pregnancy and has Supervision of normal pregnancy, antepartum and BMI 45.0-49.9, adult (HCC) on their problem list.  Patient reports no complaints.  Contractions: Irritability. Vag. Bleeding: None.  Movement: Present. Denies leaking of fluid.   The following portions of the patient's history were reviewed and updated as appropriate: allergies, current medications, past family history, past medical history, past social history, past surgical history and problem list. Problem list updated.  Objective:   Vitals:   02/09/18 1544 02/09/18 1545  BP: 139/83 132/87  Pulse: 90 80  Weight: (!) 351 lb 1.6 oz (159.3 kg)     Fetal Status: Fetal Heart Rate (bpm): NST   Movement: Present     General:  Alert, oriented and cooperative. Patient is in no acute distress.  Skin: Skin is warm and dry. No rash noted.   Cardiovascular: Normal heart rate noted  Respiratory: Normal respiratory effort, no problems with respiration noted  Abdomen: Soft, gravid, appropriate for gestational age.  Pain/Pressure: Present     Pelvic: Cervical exam performed        Extremities: Normal range of motion.  Edema: Trace  Mental Status:  Normal mood and affect. Normal behavior. Normal judgment and thought content.  Procedure: Patient informed of R/B/A of procedure. NST was performed and was reactive prior to procedure. NST:  EFM: Baseline: 130 bpm, Variability: Good {> 6 bpm), Accelerations: Reactive and Decelerations: Absent Toco: none Procedure done to begin ripening of the cervix prior to admission for induction of labor. Appropriate time out taken. The patient was placed in the lithotomy position and the cervix brought into view with sterile speculum. A ring forcep was used to guide the 88F foley balloon through the  internal os of the cervix. Foley Balloon filled with 60cc of sterile water. Plug inserted into end of the foley. Foley placed on tension and taped to medical thigh.  NST:  EFM Baseline: 130 bpm, Variability: Good {> 6 bpm), Accelerations: Reactive and Decelerations: Absent  Toco: none There were no signs of tachysystole or hypertonus. All equipment was removed and accounted for. The patient tolerated the procedure well.  Assessment and Plan:  Pregnancy: G1P0 at 6639w6d 1. Supervision of normal first pregnancy, antepartum Patient is doing well Scheduled for postdate induction tomorrow   S/p Outpatient placement of foley balloon catheter for cervical ripening. Induction of labor scheduled for tomorrow at 0700 am. Reassuring FHR tracing with no concerns at present. Warning signs given to patient to include return to MAU for heavy vaginal bleeding, Rupture of membranes, painful uterine contractions q 5 mins or less, severe abdominal discomfort, decreased fetal movement.  Return in about 6 weeks (around 03/23/2018) for postpartum visit.   [name], MD 02/09/2018 5:10 PM

## 2018-02-09 NOTE — Patient Instructions (Signed)

## 2018-02-10 ENCOUNTER — Inpatient Hospital Stay (HOSPITAL_COMMUNITY): Admission: RE | Admit: 2018-02-10 | Payer: 59 | Source: Ambulatory Visit

## 2018-02-10 ENCOUNTER — Inpatient Hospital Stay (HOSPITAL_COMMUNITY)
Admission: AD | Admit: 2018-02-10 | Discharge: 2018-02-13 | DRG: 787 | Disposition: A | Discharge status: Home / Self Care | Payer: 59 | Source: Ambulatory Visit | Attending: Obstetrics & Gynecology | Admitting: Obstetrics & Gynecology

## 2018-02-10 ENCOUNTER — Encounter (HOSPITAL_COMMUNITY): Payer: Self-pay | Admitting: *Deleted

## 2018-02-10 ENCOUNTER — Other Ambulatory Visit: Payer: Self-pay

## 2018-02-10 ENCOUNTER — Encounter: Payer: Self-pay | Admitting: *Deleted

## 2018-02-10 DIAGNOSIS — Z87891 Personal history of nicotine dependence: Secondary | ICD-10-CM

## 2018-02-10 DIAGNOSIS — O480 Post-term pregnancy: Secondary | ICD-10-CM | POA: Diagnosis present

## 2018-02-10 DIAGNOSIS — O9972 Diseases of the skin and subcutaneous tissue complicating childbirth: Secondary | ICD-10-CM | POA: Diagnosis present

## 2018-02-10 DIAGNOSIS — L02211 Cutaneous abscess of abdominal wall: Secondary | ICD-10-CM | POA: Diagnosis present

## 2018-02-10 DIAGNOSIS — Z3A41 41 weeks gestation of pregnancy: Secondary | ICD-10-CM | POA: Diagnosis not present

## 2018-02-10 DIAGNOSIS — O99214 Obesity complicating childbirth: Secondary | ICD-10-CM | POA: Diagnosis present

## 2018-02-10 DIAGNOSIS — Z98891 History of uterine scar from previous surgery: Secondary | ICD-10-CM

## 2018-02-10 HISTORY — DX: Unspecified asthma, uncomplicated: J45.909

## 2018-02-10 LAB — CBC
HCT: 33.3 % — ABNORMAL LOW (ref 36.0–46.0)
HEMOGLOBIN: 11.4 g/dL — AB (ref 12.0–15.0)
MCH: 30.2 pg (ref 26.0–34.0)
MCHC: 34.2 g/dL (ref 30.0–36.0)
MCV: 88.3 fL (ref 78.0–100.0)
Platelets: 270 10*3/uL (ref 150–400)
RBC: 3.77 MIL/uL — AB (ref 3.87–5.11)
RDW: 14.2 % (ref 11.5–15.5)
WBC: 13.8 10*3/uL — ABNORMAL HIGH (ref 4.0–10.5)

## 2018-02-10 LAB — TYPE AND SCREEN
ABO/RH(D): B POS
Antibody Screen: NEGATIVE

## 2018-02-10 LAB — OB RESULTS CONSOLE ABO/RH: RH Type: POSITIVE

## 2018-02-10 LAB — RPR: RPR Ser Ql: NONREACTIVE

## 2018-02-10 LAB — ABO/RH: ABO/RH(D): B POS

## 2018-02-10 MED ORDER — TERBUTALINE SULFATE 1 MG/ML IJ SOLN: 0.2500 mg | Freq: Once | INTRAMUSCULAR | Status: DC | PRN

## 2018-02-10 MED ORDER — OXYTOCIN 40 UNITS IN LACTATED RINGERS INFUSION - SIMPLE MED: 2.5000 [IU]/h | INTRAVENOUS | Status: DC

## 2018-02-10 MED ORDER — OXYTOCIN BOLUS FROM INFUSION: 500.0000 mL | Freq: Once | INTRAVENOUS | Status: DC

## 2018-02-10 MED ORDER — MISOPROSTOL 25 MCG QUARTER TABLET: 25.0000 ug | ORAL_TABLET | ORAL | Status: DC | PRN

## 2018-02-10 MED ORDER — FENTANYL CITRATE (PF) 100 MCG/2ML IJ SOLN
100.0000 ug | INTRAMUSCULAR | Status: DC | PRN
  Administered 2018-02-10 (×2): 100 ug via INTRAVENOUS
  Filled 2018-02-10 (×3): qty 2

## 2018-02-10 MED ORDER — SOD CITRATE-CITRIC ACID 500-334 MG/5ML PO SOLN
30.0000 mL | ORAL | Status: DC | PRN
  Administered 2018-02-11: 30 mL via ORAL
  Filled 2018-02-10: qty 15

## 2018-02-10 MED ORDER — ONDANSETRON HCL 4 MG/2ML IJ SOLN: 4.0000 mg | Freq: Four times a day (QID) | INTRAMUSCULAR | Status: DC | PRN

## 2018-02-10 MED ORDER — ACETAMINOPHEN 325 MG PO TABS: 650.0000 mg | ORAL_TABLET | ORAL | Status: DC | PRN

## 2018-02-10 MED ORDER — LACTATED RINGERS IV SOLN
500.0000 mL | INTRAVENOUS | Status: DC | PRN
  Administered 2018-02-10 (×2): 1000 mL via INTRAVENOUS
  Administered 2018-02-10: 500 mL via INTRAVENOUS
  Administered 2018-02-10: 1000 mL via INTRAVENOUS
  Administered 2018-02-11: 500 mL via INTRAVENOUS

## 2018-02-10 MED ORDER — OXYTOCIN 40 UNITS IN LACTATED RINGERS INFUSION - SIMPLE MED
1.0000 m[IU]/min | INTRAVENOUS | Status: DC
  Administered 2018-02-10: 2 m[IU]/min via INTRAVENOUS
  Filled 2018-02-10: qty 1000

## 2018-02-10 MED ORDER — LIDOCAINE HCL (PF) 1 % IJ SOLN: 30.0000 mL | INTRAMUSCULAR | Status: DC | PRN

## 2018-02-10 MED ORDER — OXYCODONE-ACETAMINOPHEN 5-325 MG PO TABS: 1.0000 | ORAL_TABLET | ORAL | Status: DC | PRN

## 2018-02-10 MED ORDER — OXYCODONE-ACETAMINOPHEN 5-325 MG PO TABS: 2.0000 | ORAL_TABLET | ORAL | Status: DC | PRN

## 2018-02-10 MED ORDER — TERBUTALINE SULFATE 1 MG/ML IJ SOLN
0.2500 mg | Freq: Once | INTRAMUSCULAR | Status: DC | PRN
  Filled 2018-02-10: qty 1

## 2018-02-10 MED ORDER — LACTATED RINGERS IV SOLN
INTRAVENOUS | Status: DC
  Administered 2018-02-10 (×3): via INTRAVENOUS

## 2018-02-10 NOTE — H&P (Addendum)
OBSTETRIC ADMISSION HISTORY AND PHYSICAL  Brooke Bryant is a 32 y.o. female G1P0 with IUP at 236w0d by LMP presenting for IOL for PD. She reports +FMs, No LOF, no VB, no blurry vision, headaches or peripheral edema, and RUQ pain.  She plans on breast feeding. She request IUD for birth control. [facility]  Patient had outpatient foley bulb placement yesterday for cervical ripening. Presented to the MAU for scant vaginal bleeding, foley bulb still in place.  Dating: By LMP --->  Estimated Date of Delivery: 02/03/18  Sono:   @[redacted]w[redacted]d , CWD, normal anatomy (heart and spine not well visualized 2/2 maternal body habitus), breech presentation, 1513g, 45% EFW. AFI normal. Posterior placenta.  Prenatal History/Complications: Obesity (BMI 58)  Past Medical History: Past Medical History:  Diagnosis Date  . Arthritis   . Headache     Past Surgical History: Past Surgical History:  Procedure Laterality Date  . GUM SURGERY    . KNEE SURGERY Left     Obstetrical History: OB History    Gravida  1   Para      Term      Preterm      AB      Living        SAB      TAB      Ectopic      Multiple      Live Births              Social History: Social History   Socioeconomic History  . Marital status: Married    Spouse name: Not on file  . Number of children: Not on file  . Years of education: Not on file  . Highest education level: Not on file  Occupational History  . Not on file  Social Needs  . Financial resource strain: Not on file  . Food insecurity:    Worry: Not on file    Inability: Not on file  . Transportation needs:    Medical: Not on file    Non-medical: Not on file  Tobacco Use  . Smoking status: Former Smoker    Last attempt to quit: 01/21/2004    Years since quitting: 14.0  . Smokeless tobacco: Never Used  Substance and Sexual Activity  . Alcohol use: No    Frequency: Never  . Drug use: No  . Sexual  activity: Yes  Lifestyle  . Physical activity:    Days per week: Not on file    Minutes per session: Not on file  . Stress: Not on file  Relationships  . Social connections:    Talks on phone: Not on file    Gets together: Not on file    Attends religious service: Not on file    Active member of club or organization: Not on file    Attends meetings of clubs or organizations: Not on file    Relationship status: Not on file  Other Topics Concern  . Not on file  Social History Narrative  . Not on file    Family History: Family History  Problem Relation Age of Onset  . Depression Mother   . Varicose Veins Mother   . Heart disease Father   . Hypertension Father   . Cancer Maternal Aunt   . Arthritis Maternal Grandfather   . Cancer Maternal Grandfather     Allergies: No Known Allergies  Medications Prior to Admission  Medication Sig Dispense Refill Last  Dose  . Prenat-FeAsp-Meth-FA-DHA w/o A (PRENATE PIXIE) 10-0.6-0.4-200 MG CAPS Take 1 capsule by mouth daily before breakfast. 90 capsule 4 02/09/2018 at Unknown time  . albuterol (PROVENTIL HFA;VENTOLIN HFA) 108 (90 Base) MCG/ACT inhaler Inhale 1-2 puffs into the lungs every 6 (six) hours as needed for wheezing or shortness of breath.   More than a month at Unknown time  . cyclobenzaprine (FLEXERIL) 10 MG tablet Take 1 tablet (10 mg total) by mouth every 8 (eight) hours as needed for muscle spasms. (Patient not taking: Reported on 01/17/2018) 84 tablet 0 Not Taking  . Elastic Bandages & Supports (COMFORT FIT MATERNITY SUPP SM) MISC Wear as directed. 1 each 0 Taking  . ibuprofen (ADVIL,MOTRIN) 200 MG tablet Take 400 mg by mouth every 4 (four) hours as needed for fever.   Not Taking   Review of Systems   All systems reviewed and negative except as stated in HPI  Blood pressure (!) 142/82, pulse 85, temperature 97.9 F (36.6 C), temperature source Oral, resp. rate 20, height 5\' 5"  (1.651 m), weight (!) 351 lb 4 oz (159.3 kg), last  menstrual period 04/29/2017. General appearance: alert, cooperative, appears stated age and no distress, morbidly obese Lungs: no respiratory distress Heart: regular rate and rhythm Abdomen: soft, non-tender Extremities: Homans sign is negative, no sign of DVT Presentation: cephalic, confirmed with bedside US Fetal monitoringBaseline: 145 bpm, Variability: Good {> 6 bpm), Accelerations: Reactive and Decelerations: Variable:   Uterine activity irregular, not well traced.   Prenatal labs: ABO, Rh: B/Positive/-- (12/18 1501) Antibody: Negative (12/18 1501) Rubella: 6.39 (12/18 1501) RPR: Non Reactive (04/08 1021)  HBsAg: Negative (12/18 1501)  HIV: Non Reactive (04/08 1021)  GBS: Negative (06/10 0940)  2 hr Glucola 88, 145, 49 Genetic screening  AFP neg Anatomy US @ [redacted]w[redacted]d, normal anatomy, female fetus  Prenatal Transfer Tool  Maternal Diabetes: No Genetic Screening: Normal Maternal Ultrasounds/Referrals: Normal Fetal Ultrasounds or other Referrals:  None Maternal Substance Abuse:  No Significant Maternal Medications:  None Significant Maternal Lab Results: None  No results found for this or any previous visit (from the past 24 hour(s)).  Patient Active Problem List   Diagnosis Date Noted  . BMI 45.0-49.9, adult (HCC) 08/02/2017  . Supervision of normal pregnancy, antepartum 07/18/2017    Assessment/Plan:  Brooke Bryant is a 32 y.o. G1P0 at [redacted]w[redacted]d here for IOL for PD.  #Labor: FB still in place, monitor for now with variables. Can consider starting Pitocin once FB out. #Pain: Labor support without medications #FWB: Cat II #ID:  GBS neg #MOF: breast #MOC: IUD #Circ:  Yes (unsure if she wants inpatient vs outpatient)  Ellwood Dense, DO  02/10/2018, 5:07 AM  FHR now cat 1, no ctx.  Foley still in. Will start pitocin

## 2018-02-10 NOTE — Progress Notes (Signed)
LABOR PROGRESS NOTE  Brooke Bryant is a 32 y.o. G1P0 at 3127w0d  admitted for IOL for postdates, s/p outpatient FB  Subjective: Patient doing well. Reports feeling some contractions, but pain tolerable.   Objective: BP (!) 108/51   Pulse 92   Temp 98.8 F (37.1 C) (Oral)   Resp 16   Ht 5\' 5"  (1.651 m)   Wt (!) 351 lb 4 oz (159.3 kg)   LMP 04/29/2017 (Exact Date)   SpO2 100%   BMI 58.45 kg/m  or  Vitals:   02/10/18 2000 02/10/18 2030 02/10/18 2051 02/10/18 2053  BP: 136/66 (!) 108/51    Pulse: 90 92    Resp: 18 16    Temp:      TempSrc:      SpO2: 100% 99% 100% 100%  Weight:      Height:        SVE: Dilation: 3.5 Effacement (%): 50 Cervical Position: Anterior Station: -3 Presentation: Vertex Exam by:: Ramyah Pankowski FHT: baseline rate 150, moderate varibility, +acel, variable decels Toco: q 2-5 min; MVUs ~200  Assessment / Plan: 32 y.o. G1P0000 at 6427w0d here for IOL for PD,  Labor: S/p outpatient FB. Now on IV Pitocin. AROM @2045  with clear fluid, IUPC palced Fetal Wellbeing:  Cat II; FSE and IUPC placed. Will consider amnioinfusion if repetitive variables Pain Control:  IV pain meds; not planing on epidural Anticipated MOD:  SVD  Frederik PearJulie P Aala Ransom, MD 02/10/2018, 9:01 PM

## 2018-02-10 NOTE — Progress Notes (Signed)
Subjective: Brooke Bryant is a 32 y.o. G1P0000 at 7214w0d by LMP admitted for induction of labor due to Post dates. Due date 02/03/2018.  Objective: BP 113/61   Pulse 99   Temp 98.5 F (36.9 C) (Oral)   Resp 18   Ht 5\' 5"  (1.651 m)   Wt (!) 159.3 kg (351 lb 4 oz)   LMP 04/29/2017 (Exact Date)   SpO2 100%   BMI 58.45 kg/m   Total I/O In: 216.4 [I.V.:216.4] Out: -   FHT:  FHR: 130 bpm, variability: moderate,  accelerations:  Present,  decelerations:  Absent UC:   regular, every 2-4 minutes SVE:   Dilation: 4 Effacement (%): 50 Station: -3 Exam by:: Brooke Bryant, CNM Foley bulb out with no resistance; bloody show with membrane sweep; BBOW with UC's  Labs: Lab Results  Component Value Date   WBC 13.8 (H) 02/10/2018   HGB 11.4 (L) 02/10/2018   HCT 33.3 (L) 02/10/2018   MCV 88.3 02/10/2018   PLT 270 02/10/2018    Assessment / Plan: Induction of labor due to postterm,  progressing well on pitocin  Labor: Progressing normally Preeclampsia:  n/a Fetal Wellbeing:  Category I Pain Control:  Labor support without medications I/D:  n/a Anticipated MOD:  NSVD  Brooke Moraolitta Melvine Julin, MSN, CNM 02/10/2018, 3:30 PM

## 2018-02-10 NOTE — MAU Note (Signed)
[facility]   IS AN INDUCTION  TODAY AT 0700.    HAS FOLEY BULB IN - AT 4 PM YESTERDAY.   HAS RED  VAG BLEEDING -  STARTED AT 530PM-.  THIS AM  REDDISH BROWN  BLEEDING  WHEN SHE WIPES AND IN UNDERWEAR- .   FEELS PRESSURE, MILD CRAMPS.  FEELS BABY MOVE.   DENIES HSV AND MRSA.  YESTERDAY - VE - CLOSED. GBS- NEG

## 2018-02-10 NOTE — Anesthesia Pain Management Evaluation Note (Signed)
  [facility] Pain Management Visit Note  Patient: Brooke Bryant, 32 y.o., female  "Hello [facility] am [facility]. We have an anesthesia team available at all times to provide care throughout the hospital, including epidural management and anesthesia for C-section. I don't know your plan for the delivery whether it a natural birth, water birth, IV sedation, nitrous supplementation, doula or epidural, but we want to meet your pain goals."   1.Was your pain managed to your expectations on prior hospitalizations?   No prior hospitalizations  2.What is your expectation for pain management during this hospitalization?     IV pain meds  3.How can we help you reach that goal? Support prn  Record the patient's initial score and the patient's pain goal.   Pain: 3  Pain Goal: 8 [facility] wants you to be able to say your pain was always managed very well.  [facility],Brandi Armato 02/10/2018

## 2018-02-11 ENCOUNTER — Encounter (HOSPITAL_COMMUNITY)
Admission: AD | Disposition: A | Discharge status: Home / Self Care | Payer: Self-pay | Source: Ambulatory Visit | Attending: Obstetrics & Gynecology

## 2018-02-11 ENCOUNTER — Inpatient Hospital Stay (HOSPITAL_COMMUNITY): Payer: 59 | Admitting: Anesthesiology

## 2018-02-11 ENCOUNTER — Encounter (HOSPITAL_COMMUNITY): Payer: Self-pay | Admitting: Obstetrics & Gynecology

## 2018-02-11 DIAGNOSIS — Z3A41 41 weeks gestation of pregnancy: Secondary | ICD-10-CM

## 2018-02-11 DIAGNOSIS — O480 Post-term pregnancy: Secondary | ICD-10-CM

## 2018-02-11 SURGERY — Surgical Case
Anesthesia: Regional

## 2018-02-11 MED ORDER — DIPHENHYDRAMINE HCL 25 MG PO CAPS
25.0000 mg | ORAL_CAPSULE | ORAL | Status: DC | PRN
  Filled 2018-02-11: qty 1

## 2018-02-11 MED ORDER — KETOROLAC TROMETHAMINE 30 MG/ML IJ SOLN
INTRAMUSCULAR | Status: AC
  Filled 2018-02-11: qty 1

## 2018-02-11 MED ORDER — LIDOCAINE HCL (PF) 1 % IJ SOLN
INTRAMUSCULAR | Status: DC | PRN
  Administered 2018-02-11: 5 mL via EPIDURAL
  Administered 2018-02-11: 2 mL via EPIDURAL
  Administered 2018-02-11: 3 mL via EPIDURAL

## 2018-02-11 MED ORDER — SIMETHICONE 80 MG PO CHEW: 80.0000 mg | CHEWABLE_TABLET | ORAL | Status: DC | PRN

## 2018-02-11 MED ORDER — OXYTOCIN 10 UNIT/ML IJ SOLN
INTRAVENOUS | Status: DC | PRN
  Administered 2018-02-11: 40 [IU] via INTRAVENOUS

## 2018-02-11 MED ORDER — ENOXAPARIN SODIUM 80 MG/0.8ML ~~LOC~~ SOLN
80.0000 mg | SUBCUTANEOUS | Status: DC
  Administered 2018-02-12 – 2018-02-13 (×2): 80 mg via SUBCUTANEOUS
  Filled 2018-02-11 (×3): qty 0.8

## 2018-02-11 MED ORDER — DIBUCAINE 1 % RE OINT: 1.0000 "application " | TOPICAL_OINTMENT | RECTAL | Status: DC | PRN

## 2018-02-11 MED ORDER — PHENYLEPHRINE HCL 10 MG/ML IJ SOLN
INTRAMUSCULAR | Status: DC | PRN
  Administered 2018-02-11: 80 ug via INTRAVENOUS
  Administered 2018-02-11: 40 ug via INTRAVENOUS
  Administered 2018-02-11: 80 ug via INTRAVENOUS
  Administered 2018-02-11 (×2): 40 ug via INTRAVENOUS

## 2018-02-11 MED ORDER — SODIUM BICARBONATE 8.4 % IV SOLN
INTRAVENOUS | Status: DC | PRN
  Administered 2018-02-11 (×2): 5 mL via EPIDURAL

## 2018-02-11 MED ORDER — EPHEDRINE 5 MG/ML INJ: 10.0000 mg | INTRAVENOUS | Status: DC | PRN

## 2018-02-11 MED ORDER — SIMETHICONE 80 MG PO CHEW
80.0000 mg | CHEWABLE_TABLET | Freq: Three times a day (TID) | ORAL | Status: DC
  Administered 2018-02-12 – 2018-02-13 (×4): 80 mg via ORAL
  Filled 2018-02-11 (×4): qty 1

## 2018-02-11 MED ORDER — ACETAMINOPHEN 500 MG PO TABS
1000.0000 mg | ORAL_TABLET | Freq: Four times a day (QID) | ORAL | Status: AC
  Administered 2018-02-11: 1000 mg via ORAL
  Filled 2018-02-11: qty 2

## 2018-02-11 MED ORDER — WITCH HAZEL-GLYCERIN EX PADS: 1.0000 "application " | MEDICATED_PAD | CUTANEOUS | Status: DC | PRN

## 2018-02-11 MED ORDER — NALBUPHINE HCL 10 MG/ML IJ SOLN: 5.0000 mg | Freq: Once | INTRAMUSCULAR | Status: DC | PRN

## 2018-02-11 MED ORDER — COCONUT OIL OIL: 1.0000 "application " | TOPICAL_OIL | Status: DC | PRN

## 2018-02-11 MED ORDER — PRENATAL MULTIVITAMIN CH
1.0000 | ORAL_TABLET | Freq: Every day | ORAL | Status: DC
  Administered 2018-02-12 – 2018-02-13 (×2): 1 via ORAL
  Filled 2018-02-11 (×2): qty 1

## 2018-02-11 MED ORDER — NALOXONE HCL 0.4 MG/ML IJ SOLN: 0.4000 mg | INTRAMUSCULAR | Status: DC | PRN

## 2018-02-11 MED ORDER — LACTATED RINGERS IV SOLN
INTRAVENOUS | Status: DC | PRN
  Administered 2018-02-11 (×3): via INTRAVENOUS

## 2018-02-11 MED ORDER — TETANUS-DIPHTH-ACELL PERTUSSIS 5-2.5-18.5 LF-MCG/0.5 IM SUSP: 0.5000 mL | Freq: Once | INTRAMUSCULAR | Status: DC

## 2018-02-11 MED ORDER — OXYTOCIN 40 UNITS IN LACTATED RINGERS INFUSION - SIMPLE MED: 2.5000 [IU]/h | INTRAVENOUS | Status: AC

## 2018-02-11 MED ORDER — ONDANSETRON HCL 4 MG/2ML IJ SOLN
4.0000 mg | Freq: Three times a day (TID) | INTRAMUSCULAR | Status: DC | PRN
  Administered 2018-02-11: 4 mg via INTRAVENOUS
  Filled 2018-02-11: qty 2

## 2018-02-11 MED ORDER — ONDANSETRON HCL 4 MG/2ML IJ SOLN
INTRAMUSCULAR | Status: DC | PRN
  Administered 2018-02-11: 4 mg via INTRAVENOUS

## 2018-02-11 MED ORDER — DEXAMETHASONE SODIUM PHOSPHATE 4 MG/ML IJ SOLN
INTRAMUSCULAR | Status: DC | PRN
  Administered 2018-02-11: 4 mg via INTRAVENOUS

## 2018-02-11 MED ORDER — PHENYLEPHRINE 40 MCG/ML (10ML) SYRINGE FOR IV PUSH (FOR BLOOD PRESSURE SUPPORT)
PREFILLED_SYRINGE | INTRAVENOUS | Status: AC
  Filled 2018-02-11: qty 10

## 2018-02-11 MED ORDER — ZOLPIDEM TARTRATE 5 MG PO TABS: 5.0000 mg | ORAL_TABLET | Freq: Every evening | ORAL | Status: DC | PRN

## 2018-02-11 MED ORDER — NALOXONE HCL 4 MG/10ML IJ SOLN
1.0000 ug/kg/h | INTRAVENOUS | Status: DC | PRN
  Filled 2018-02-11: qty 5

## 2018-02-11 MED ORDER — PHENYLEPHRINE 40 MCG/ML (10ML) SYRINGE FOR IV PUSH (FOR BLOOD PRESSURE SUPPORT)
80.0000 ug | PREFILLED_SYRINGE | INTRAVENOUS | Status: DC | PRN
  Filled 2018-02-11 (×2): qty 10

## 2018-02-11 MED ORDER — ONDANSETRON HCL 4 MG/2ML IJ SOLN
INTRAMUSCULAR | Status: AC
  Filled 2018-02-11: qty 2

## 2018-02-11 MED ORDER — DEXTROSE 5 % IV SOLN
3.0000 g | Freq: Once | INTRAVENOUS | Status: AC
  Administered 2018-02-11: 3 g via INTRAVENOUS
  Filled 2018-02-11: qty 3000

## 2018-02-11 MED ORDER — EPHEDRINE 5 MG/ML INJ
10.0000 mg | INTRAVENOUS | Status: DC | PRN
  Filled 2018-02-11: qty 4

## 2018-02-11 MED ORDER — DIPHENHYDRAMINE HCL 50 MG/ML IJ SOLN: 12.5000 mg | INTRAMUSCULAR | Status: DC | PRN

## 2018-02-11 MED ORDER — PHENYLEPHRINE 40 MCG/ML (10ML) SYRINGE FOR IV PUSH (FOR BLOOD PRESSURE SUPPORT)
80.0000 ug | PREFILLED_SYRINGE | INTRAVENOUS | Status: DC | PRN
  Administered 2018-02-11 (×2): 80 ug via INTRAVENOUS

## 2018-02-11 MED ORDER — KETOROLAC TROMETHAMINE 30 MG/ML IJ SOLN
30.0000 mg | Freq: Four times a day (QID) | INTRAMUSCULAR | Status: AC | PRN
  Administered 2018-02-11: 30 mg via INTRAVENOUS
  Filled 2018-02-11: qty 1

## 2018-02-11 MED ORDER — NALBUPHINE HCL 10 MG/ML IJ SOLN: 5.0000 mg | INTRAMUSCULAR | Status: DC | PRN

## 2018-02-11 MED ORDER — SCOPOLAMINE 1 MG/3DAYS TD PT72: 1.0000 | MEDICATED_PATCH | Freq: Once | TRANSDERMAL | Status: DC

## 2018-02-11 MED ORDER — LACTATED RINGERS IV SOLN
INTRAVENOUS | Status: DC
  Administered 2018-02-11 – 2018-02-12 (×2): via INTRAVENOUS

## 2018-02-11 MED ORDER — DIPHENHYDRAMINE HCL 25 MG PO CAPS: 25.0000 mg | ORAL_CAPSULE | Freq: Four times a day (QID) | ORAL | Status: DC | PRN

## 2018-02-11 MED ORDER — FENTANYL CITRATE (PF) 100 MCG/2ML IJ SOLN: 25.0000 ug | INTRAMUSCULAR | Status: DC | PRN

## 2018-02-11 MED ORDER — SCOPOLAMINE 1 MG/3DAYS TD PT72
MEDICATED_PATCH | TRANSDERMAL | Status: DC | PRN
  Administered 2018-02-11: 1 via TRANSDERMAL

## 2018-02-11 MED ORDER — OXYTOCIN 10 UNIT/ML IJ SOLN
INTRAMUSCULAR | Status: AC
  Filled 2018-02-11: qty 4

## 2018-02-11 MED ORDER — FENTANYL 2.5 MCG/ML BUPIVACAINE 1/10 % EPIDURAL INFUSION (WH - ANES)
14.0000 mL/h | INTRAMUSCULAR | Status: DC | PRN
  Administered 2018-02-11: 14 mL/h via EPIDURAL
  Filled 2018-02-11: qty 100

## 2018-02-11 MED ORDER — LACTATED RINGERS IV SOLN: 500.0000 mL | Freq: Once | INTRAVENOUS | Status: AC

## 2018-02-11 MED ORDER — OXYCODONE HCL 5 MG PO TABS: 5.0000 mg | ORAL_TABLET | ORAL | Status: DC | PRN

## 2018-02-11 MED ORDER — SODIUM CHLORIDE 0.9% FLUSH: 3.0000 mL | INTRAVENOUS | Status: DC | PRN

## 2018-02-11 MED ORDER — SODIUM CHLORIDE 0.9 % IR SOLN
Status: DC | PRN
  Administered 2018-02-11: 200 mL

## 2018-02-11 MED ORDER — IBUPROFEN 600 MG PO TABS
600.0000 mg | ORAL_TABLET | Freq: Four times a day (QID) | ORAL | Status: DC
  Administered 2018-02-11 – 2018-02-13 (×7): 600 mg via ORAL
  Filled 2018-02-11 (×7): qty 1

## 2018-02-11 MED ORDER — MORPHINE SULFATE (PF) 0.5 MG/ML IJ SOLN
INTRAMUSCULAR | Status: DC | PRN
  Administered 2018-02-11: 4 mg via EPIDURAL

## 2018-02-11 MED ORDER — OXYCODONE HCL 5 MG PO TABS: 10.0000 mg | ORAL_TABLET | ORAL | Status: DC | PRN

## 2018-02-11 MED ORDER — SENNOSIDES-DOCUSATE SODIUM 8.6-50 MG PO TABS
2.0000 | ORAL_TABLET | ORAL | Status: DC
  Administered 2018-02-11 – 2018-02-12 (×2): 2 via ORAL
  Filled 2018-02-11 (×2): qty 2

## 2018-02-11 MED ORDER — DEXAMETHASONE SODIUM PHOSPHATE 4 MG/ML IJ SOLN
INTRAMUSCULAR | Status: AC
  Filled 2018-02-11: qty 1

## 2018-02-11 MED ORDER — SIMETHICONE 80 MG PO CHEW
80.0000 mg | CHEWABLE_TABLET | ORAL | Status: DC
  Administered 2018-02-11 – 2018-02-12 (×2): 80 mg via ORAL
  Filled 2018-02-11 (×2): qty 1

## 2018-02-11 MED ORDER — LACTATED RINGERS IV BOLUS
500.0000 mL | Freq: Once | INTRAVENOUS | Status: AC
  Administered 2018-02-11: 500 mL via INTRAVENOUS

## 2018-02-11 MED ORDER — SODIUM CHLORIDE 0.9 % IV SOLN
500.0000 mg | Freq: Once | INTRAVENOUS | Status: AC
  Administered 2018-02-11: 500 mg via INTRAVENOUS
  Filled 2018-02-11: qty 500

## 2018-02-11 MED ORDER — SCOPOLAMINE 1 MG/3DAYS TD PT72
MEDICATED_PATCH | TRANSDERMAL | Status: AC
  Filled 2018-02-11: qty 1

## 2018-02-11 MED ORDER — MEPERIDINE HCL 25 MG/ML IJ SOLN: 6.2500 mg | INTRAMUSCULAR | Status: DC | PRN

## 2018-02-11 MED ORDER — PIPERACILLIN-TAZOBACTAM 3.375 G IVPB
3.3750 g | Freq: Three times a day (TID) | INTRAVENOUS | Status: AC
  Administered 2018-02-11 (×3): 3.375 g via INTRAVENOUS
  Filled 2018-02-11 (×3): qty 50

## 2018-02-11 MED ORDER — MORPHINE SULFATE (PF) 0.5 MG/ML IJ SOLN
INTRAMUSCULAR | Status: AC
  Filled 2018-02-11: qty 10

## 2018-02-11 MED ORDER — KETOROLAC TROMETHAMINE 30 MG/ML IJ SOLN
30.0000 mg | Freq: Four times a day (QID) | INTRAMUSCULAR | Status: AC | PRN
  Administered 2018-02-11: 30 mg via INTRAMUSCULAR

## 2018-02-11 MED ORDER — ACETAMINOPHEN 325 MG PO TABS: 650.0000 mg | ORAL_TABLET | ORAL | Status: DC | PRN

## 2018-02-11 MED ORDER — LACTATED RINGERS AMNIOINFUSION
INTRAVENOUS | Status: DC
  Administered 2018-02-11: 01:00:00 via INTRAUTERINE
  Filled 2018-02-11 (×2): qty 1000

## 2018-02-11 MED ORDER — MENTHOL 3 MG MT LOZG: 1.0000 | LOZENGE | OROMUCOSAL | Status: DC | PRN

## 2018-02-11 MED ORDER — PROMETHAZINE HCL 25 MG/ML IJ SOLN: 6.2500 mg | INTRAMUSCULAR | Status: DC | PRN

## 2018-02-11 MED ORDER — SODIUM CHLORIDE 0.9 % IR SOLN
Status: DC | PRN
  Administered 2018-02-11: 1000 mL

## 2018-02-11 SURGICAL SUPPLY — 46 items
ADH SKN CLS APL DERMABOND .7 (GAUZE/BANDAGES/DRESSINGS) ×2
CHLORAPREP W/TINT 26ML (MISCELLANEOUS) ×6 IMPLANT
CLAMP CORD UMBIL (MISCELLANEOUS) IMPLANT
CLOTH BEACON ORANGE TIMEOUT ST (SAFETY) ×3 IMPLANT
DERMABOND ADVANCED (GAUZE/BANDAGES/DRESSINGS) ×4
DERMABOND ADVANCED .7 DNX12 (GAUZE/BANDAGES/DRESSINGS) ×2 IMPLANT
DRESSING PREVENA PLUS CUSTOM (GAUZE/BANDAGES/DRESSINGS) IMPLANT
DRSG OPSITE POSTOP 4X10 (GAUZE/BANDAGES/DRESSINGS) ×3 IMPLANT
DRSG PREVENA PLUS CUSTOM (GAUZE/BANDAGES/DRESSINGS) ×3
ELECT REM PT RETURN 9FT ADLT (ELECTROSURGICAL) ×3
ELECTRODE REM PT RTRN 9FT ADLT (ELECTROSURGICAL) ×1 IMPLANT
EXTRACTOR VACUUM BELL STYLE (SUCTIONS) IMPLANT
GLOVE BIOGEL PI IND STRL 7.0 (GLOVE) ×1 IMPLANT
GLOVE BIOGEL PI IND STRL 8 (GLOVE) ×1 IMPLANT
GLOVE BIOGEL PI INDICATOR 7.0 (GLOVE) ×2
GLOVE BIOGEL PI INDICATOR 8 (GLOVE) ×2
GLOVE ECLIPSE 8.0 STRL XLNG CF (GLOVE) ×3 IMPLANT
GOWN STRL REUS W/TWL LRG LVL3 (GOWN DISPOSABLE) ×6 IMPLANT
HOVERMATT SINGLE USE (MISCELLANEOUS) ×2 IMPLANT
KIT ABG SYR 3ML LUER SLIP (SYRINGE) ×3 IMPLANT
NDL HYPO 18GX1.5 BLUNT FILL (NEEDLE) ×1 IMPLANT
NDL HYPO 25X5/8 SAFETYGLIDE (NEEDLE) ×1 IMPLANT
NEEDLE HYPO 18GX1.5 BLUNT FILL (NEEDLE) ×3 IMPLANT
NEEDLE HYPO 22GX1.5 SAFETY (NEEDLE) ×3 IMPLANT
NEEDLE HYPO 25X5/8 SAFETYGLIDE (NEEDLE) ×3 IMPLANT
NS IRRIG 1000ML POUR BTL (IV SOLUTION) ×3 IMPLANT
PACK C SECTION WH (CUSTOM PROCEDURE TRAY) ×3 IMPLANT
PAD OB MATERNITY 4.3X12.25 (PERSONAL CARE ITEMS) ×3 IMPLANT
PENCIL SMOKE EVAC W/HOLSTER (ELECTROSURGICAL) ×3 IMPLANT
RETRACTOR TRAXI PANNICULUS (MISCELLANEOUS) IMPLANT
RTRCTR C-SECT PINK 25CM LRG (MISCELLANEOUS) IMPLANT
STAPLE OSTEOSYNTHE COMPRESSION (GAUZE/BANDAGES/DRESSINGS) ×2 IMPLANT
SUT CHROMIC 0 CT 1 (SUTURE) ×5 IMPLANT
SUT MNCRL 0 VIOLET CTX 36 (SUTURE) ×2 IMPLANT
SUT MONOCRYL 0 CTX 36 (SUTURE) ×4
SUT PLAIN 2 0 (SUTURE)
SUT PLAIN 2 0 XLH (SUTURE) ×2 IMPLANT
SUT PLAIN ABS 2-0 CT1 27XMFL (SUTURE) IMPLANT
SUT PROLENE 4 0 P 3 18 (SUTURE) ×2 IMPLANT
SUT VIC AB 0 CTX 36 (SUTURE) ×3
SUT VIC AB 0 CTX36XBRD ANBCTRL (SUTURE) ×1 IMPLANT
SUT VIC AB 4-0 KS 27 (SUTURE) IMPLANT
SYR 20CC LL (SYRINGE) ×6 IMPLANT
TOWEL OR 17X24 6PK STRL BLUE (TOWEL DISPOSABLE) ×3 IMPLANT
TRAXI PANNICULUS RETRACTOR (MISCELLANEOUS) ×2
TRAY FOLEY W/BAG SLVR 14FR LF (SET/KITS/TRAYS/PACK) IMPLANT

## 2018-02-11 NOTE — Op Note (Signed)
Cesarean Section Operative Report  Brooke Bryant  PROCEDURE DATE: 02/11/2018  PREOPERATIVE DIAGNOSES: Intrauterine pregnancy at [redacted]w[redacted]d weeks gestation; Failed induction of labor for postdates; failure to progress in first stage  POSTOPERATIVE DIAGNOSES: The same; chronic skin abscess  PROCEDURE: Primary Low Transverse Cesarean Section; excision of chronic skin abscess  SURGEON:   Surgeon(s) and Role:    * Eure, Amaryllis Dyke, MD - Primary    * , Kandra Nicolas, MD - OB Fellow  INDICATIONS: Brooke Bryant is a 32 y.o. G1P0000 at [redacted]w[redacted]d here for cesarean section secondary to the indications listed under preoperative diagnoses; please see preoperative note for further details.  The risks of cesarean section were discussed with the patient including but were not limited to: bleeding which may require transfusion or reoperation; infection which may require antibiotics; injury to bowel, bladder, ureters or other surrounding organs; injury to the fetus; need for additional procedures including hysterectomy in the event of a life-threatening hemorrhage; placental abnormalities wth subsequent pregnancies, incisional problems, thromboembolic phenomenon and other postoperative/anesthesia complications.   The patient concurred with the proposed plan, giving informed written consent for the procedure.    FINDINGS:  Viable female infant in cephalic presentation, occiput posterior, asynclitic.  Apgars 9 and 9.  Clear amniotic fluid.  Intact placenta, three vessel cord.  Normal uterus, fallopian tubes and ovaries bilaterally. 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   ANESTHESIA: Epidural INTRAVENOUS FLUIDS: 1,200 mL ESTIMATED BLOOD LOSS: 705 mL URINE OUTPUT:  SPECIMENS: Placenta sent to pathology; excised skin abscess sent to pathology COMPLICATIONS: None immediate  PROCEDURE IN DETAIL:  The patient preoperatively received intravenous antibiotics and had sequential compression devices applied to  her lower extremities.  She was then taken to the operating room where the epidural anesthesia was dosed up to surgical level  and was found to be adequate. She was then placed in a dorsal supine position with a leftward tilt, and prepped and draped in a sterile manner.  A foley catheter was placed into her bladder and attached to constant gravity.     A time out was held and the above information confirmed. Before incision was made, chronic abscess on lower abdomen was excised with a wide incision. Scalpel and scpecimen were removed from the field so not to contaminate field. With a new scalpel, a low transverse incision was made and carried down through the subcutaneous tissue to the fascia. Fascial incision was made and extended transversely. The fascia was separated from the underlying rectus tissue superiorly and inferiorly. The peritoneum was identified and entered. Peritoneal incision was extended longitudinally. A low transverse hysterotomy was made with a scalpel and extended bilaterally bluntly.  The infant was successfully delivered vertex, the cord was clamped and cut after one minute, and the infant was handed over to the awaiting neonatology team. Cord blood was obtained for evaluation. The placenta was delivered intact and appeared normal. The uterine outline, tubes and ovaries appeared normal. The uterine incision was closed with running locked sutures of 0 Monocryl, and an imbricating layer was also placed with running locked 0 Monocryl. Hemostasis was observed. Lavage was carried out until clear. The fascia was then closed using 0 Vicryl in a running fashion.  The subcutaneous layer was irrigated, and  the skin was closed with staples while approximating area of excisional lesion as well. Prevena dressing was placed.   Disposition: PACU - hemodynamically stable.   Maternal Condition: stable    Signed: Frederik Pear, MD OB Fellow  02/11/2018 7:22 AM .

## 2018-02-11 NOTE — Progress Notes (Signed)
LABOR PROGRESS NOTE  Brooke Bryant is a 32 y.o. G1P0 at 6229w0d  admitted for IOL for postdates, s/p outpatient FB  Subjective: Patient recently go epidural, and pain is well controlled  Objective: BP (!) 103/40   Pulse 61   Temp 98 F (36.7 C)   Resp 16   Ht 5\' 5"  (1.651 m)   Wt (!) 351 lb 4 oz (159.3 kg)   LMP 04/29/2017 (Exact Date)   SpO2 100%   BMI 58.45 kg/m  or  Vitals:   02/11/18 0130 02/11/18 0136 02/11/18 0140 02/11/18 0156  BP: (!) 112/52 (!) 106/44 (!) 112/45 (!) 103/40  Pulse: 65 67 63 61  Resp: 14 16 16 16   Temp:    98 F (36.7 C)  TempSrc:      SpO2:      Weight:      Height:        SVE: Dilation: 4 Effacement (%): 80 Cervical Position: Anterior Station: -2 Presentation: Vertex Exam by:: Marquisa Salih FHT: baseline rate 150, moderate varibility, no acel, variable and late decel Toco: q 3-8 min  S/p epidural, FHT with recurrent late/variable decelerations. IV fluid bolus given pt repositined, still having decels down to the 70s-80s. IV Pitocin initially decreased, then turned off and placed on O2. Amnioinfusion started.   FHT now improved, variable decels now down to 100s with quicker recovery, but still some late decelerations  Assessment / Plan: 32 y.o. G1P0000 at 2729w0d here for IOL for PD,  Labor: Pitocin stopped d/t above. Will allow FHT to continue to recover. Plan to restart Pitocin at 58mU/min (previously at 3916mU/min) in about 30 minutes after recovery  Fetal Wellbeing:  Cat II as above; amnioinfusion started.  Pain Control:  Well-controlled with epidural Anticipated MOD:  Hopeful for SVD if fetus tolerates labor; guarded prognosis  Frederik PearJulie P Rhen Dossantos, MD 02/11/2018, 1:58 AM

## 2018-02-11 NOTE — Anesthesia Preprocedure Evaluation (Addendum)
Anesthesia Evaluation  Patient identified by MRN, date of birth, ID band Patient awake    Reviewed: Allergy & Precautions, NPO status , Patient's Chart, lab work & pertinent test results  Airway Mallampati: II  TM Distance: >3 FB Neck ROM: Full    Dental  (+) Teeth Intact, Dental Advisory Given   Pulmonary asthma , former smoker,    Pulmonary exam normal breath sounds clear to auscultation       Cardiovascular Exercise Tolerance: Good negative cardio ROS Normal cardiovascular exam Rhythm:Regular Rate:Normal     Neuro/Psych  Headaches, negative psych ROS   GI/Hepatic negative GI ROS, Neg liver ROS,   Endo/Other  Morbid obesity  Renal/GU negative Renal ROS     Musculoskeletal  (+) Arthritis ,   Abdominal   Peds  Hematology negative hematology ROS (+) Blood dyscrasia, anemia , Plt 270k   Anesthesia Other Findings Day of surgery medications reviewed with the patient.  Reproductive/Obstetrics (+) Pregnancy                             Anesthesia Physical Anesthesia Plan  ASA: IV and emergent  Anesthesia Plan: Epidural   Post-op Pain Management:    Induction:   PONV Risk Score and Plan: 2 and Treatment may vary due to age or medical condition  Airway Management Planned: Natural Airway  Additional Equipment:   Intra-op Plan:   Post-operative Plan:   Informed Consent: I have reviewed the patients History and Physical, chart, labs and discussed the procedure including the risks, benefits and alternatives for the proposed anesthesia with the patient or authorized representative who has indicated his/her understanding and acceptance.   Dental advisory given  Plan Discussed with:   Anesthesia Plan Comments: (Emergent C-section for arrest of descent.  Patient to OR with epidural in situ.  Will dose epidural for surgical anesthesia.)       Anesthesia Quick Evaluation

## 2018-02-11 NOTE — Transfer of Care (Signed)
Immediate Anesthesia Transfer of Care Note  Patient: Davey A Magadan  Procedure(s) Performed: CESAREAN SECTION (N/A )  Patient Location: PACU  Anesthesia Type:Epidural  Level of Consciousness: awake and alert   Airway & Oxygen Therapy: Patient Spontanous Breathing  Post-op Assessment: Report given to RN and Post -op Vital signs reviewed and stable  Post vital signs: Reviewed  Last Vitals:  Vitals Value Taken Time  BP 104/56 02/11/2018  7:18 AM  Temp 36.8 C 02/11/2018  7:18 AM  Pulse 78 02/11/2018  7:19 AM  Resp 22 02/11/2018  7:19 AM  SpO2 100 % 02/11/2018  7:19 AM  Vitals shown include unvalidated device data.  Last Pain:  Vitals:   02/11/18 0718  TempSrc: Oral  PainSc:       Patients Stated Pain Goal: 7 (02/10/18 0716)  Complications: No apparent anesthesia complications

## 2018-02-11 NOTE — Anesthesia Procedure Notes (Signed)
Epidural Patient location during procedure: OB Start time: 02/11/2018 12:52 AM End time: 02/11/2018 12:58 AM  Staffing Anesthesiologist: Cecile Hearingurk, Stephen Edward, MD Performed: anesthesiologist   Preanesthetic Checklist Completed: patient identified, pre-op evaluation, timeout performed, IV checked, risks and benefits discussed and monitors and equipment checked  Epidural Patient position: sitting Prep: DuraPrep Patient monitoring: blood pressure and continuous pulse ox Approach: midline Location: L3-L4 Injection technique: LOR air  Needle:  Needle type: Tuohy  Needle gauge: 17 G Needle length: 9 cm Needle insertion depth: 9 cm Catheter size: 19 Gauge Catheter at skin depth: 15 cm Test dose: negative and Other (1% Lidocaine)  Additional Notes Patient identified.  Risk benefits discussed including failed block, incomplete pain control, headache, nerve damage, paralysis, blood pressure changes, nausea, vomiting, reactions to medication both toxic or allergic, and postpartum back pain.  Patient expressed understanding and wished to proceed.  All questions were answered.  Sterile technique used throughout procedure and epidural site dressed with sterile barrier dressing. No paresthesia or other complications noted. The patient did not experience any signs of intravascular injection such as tinnitus or metallic taste in mouth nor signs of intrathecal spread such as rapid motor block. Please see nursing notes for vital signs. Reason for block:procedure for pain

## 2018-02-11 NOTE — Anesthesia Postprocedure Evaluation (Signed)
Anesthesia Post Note  Patient: Brooke Bryant  Procedure(s) Performed: CESAREAN SECTION (N/A )     Patient location during evaluation: Mother Baby Anesthesia Type: Epidural Level of consciousness: awake and alert Pain management: pain level controlled Vital Signs Assessment: post-procedure vital signs reviewed and stable Respiratory status: spontaneous breathing, nonlabored ventilation and respiratory function stable Cardiovascular status: stable Postop Assessment: no headache, no backache, epidural receding and no apparent nausea or vomiting Anesthetic complications: no    Last Vitals:  Vitals:   02/11/18 1100 02/11/18 1200  BP: (!) 106/47 106/62  Pulse: (!) 55 61  Resp: 16 16  Temp:  36.4 C  SpO2: 96% 97%    Last Pain:  Vitals:   02/11/18 1200  TempSrc: Oral  PainSc:    Pain Goal: Patients Stated Pain Goal: 7 (02/10/18 0716)               Rica RecordsICKELTON,Madex Seals

## 2018-02-11 NOTE — Progress Notes (Signed)
2425w1d Estimated Date of Delivery: 02/03/18  cx 3.5 cm/80/high  No cervical change and most importantly no descent at all FHR reveals occasional variables   Discussed at length with patient her current situation with no cervical change for about 16 hours and again most importantly absolutely no descent into the pelvis Recommended proceeding with c section and pt agrees for secondary arrest of labor  Lazaro ArmsLuther H Maddox Bratcher, MD 5:48 AM  02/11/2018

## 2018-02-12 LAB — CREATININE, SERUM: Creatinine, Ser: 0.83 mg/dL (ref 0.44–1.00)

## 2018-02-12 LAB — CBC
HCT: 31.3 % — ABNORMAL LOW (ref 36.0–46.0)
HEMOGLOBIN: 10.6 g/dL — AB (ref 12.0–15.0)
MCH: 30.1 pg (ref 26.0–34.0)
MCHC: 33.9 g/dL (ref 30.0–36.0)
MCV: 88.9 fL (ref 78.0–100.0)
Platelets: 266 10*3/uL (ref 150–400)
RBC: 3.52 MIL/uL — AB (ref 3.87–5.11)
RDW: 14.2 % (ref 11.5–15.5)
WBC: 14.6 10*3/uL — ABNORMAL HIGH (ref 4.0–10.5)

## 2018-02-12 NOTE — Lactation Note (Signed)
This note was copied from a baby's chart. Lactation Consultation Note  Patient Name: Brooke Rochel BromeBrooklyn Crite WUJWJ'XToday's Date: 02/12/2018 Reason for consult: Initial assessment;Term;1st time breastfeeding;Primapara;Infant weight loss  31 hours old FT female who is now being partially BF and formula fed by his mother she's a P1. [facility], she already knows how to hand express and has an [employer] DEBP at home, she brought it to he hospital.  [facility] with hand expression, no colostrum was observed. Mom has very large breast, and her nipples are very short shafted/semi flat. Mom had already been given a NS # 20 by her RN to help baby with the latch, but she hasn't been set up with a DEBP yet. Set mom up with breast shells and also a DEBP, instructions, cleaning and storage were reviewed.  Offered assistance with latch and mom agreed to wake baby up, LC took baby to the right breast in football position and he was able to latch after a few tries using the NS #20. He came off a couple of times, but when [facility] to assess for transfer no colostrum was noted. Only two swallows were heard but it was probably baby's own saliva. Asked mom to call for assistance if she plans to put baby to the breast again, till this point she voiced she's planning on supplementing with formula until her milk comes in and then work on BF. Discussed lactogenesis II and the importance of stimulating the breast with consistent pumping.  Encouraged mom to feed baby STS 8-12 times/24 hours or sooner if feeding cues are present. She'll pump every 3 hours and at least once at night. Mom will keep supplementing baby with [employer] formula in the mean time according the supplementation guidelines. She'll start wearing her breast shells today. BF brochure, BF resources and feeding diary were reviewed, mom is aware of LC services and will call PRN.  Maternal Data Formula Feeding for  Exclusion: No Has patient been taught Hand Expression?: Yes Does the patient have breastfeeding experience prior to this delivery?: No  Feeding Feeding Type: Breast Fed Length of feed: 15 min  LATCH Score Latch: Repeated attempts needed to sustain latch, nipple held in mouth throughout feeding, stimulation needed to elicit sucking reflex.(with NS # 20)  Audible Swallowing: None  Type of Nipple: Everted at rest and after stimulation(very short shafted)  Comfort (Breast/Nipple): Soft / non-tender  Hold (Positioning): Assistance needed to correctly position infant at breast and maintain latch.  LATCH Score: 6  Interventions Interventions: Breast feeding basics reviewed;Assisted with latch;Skin to skin;Breast massage;Hand express;Breast compression;Adjust position;Reverse pressure;Shells;DEBP;Position options;Support pillows  Lactation Tools Discussed/Used Tools: Shells;Pump;Nipple Shields Nipple shield size: 20 Shell Type: Inverted Breast pump type: Double-Electric Breast Pump WIC Program: Yes Pump Review: Setup, frequency, and cleaning Initiated by:: MPeck Date initiated:: 02/12/18   Consult Status Consult Status: Follow-up Date: 02/13/18 Follow-up type: In-patient    Laurieann Friddle Venetia ConstableS Laylynn Campanella 02/12/2018, 1:18 PM

## 2018-02-12 NOTE — Progress Notes (Signed)
Dr Constance Goltzlson notified of call from lab stating that aerobic culture ordered 7/13 a.m. was not received.

## 2018-02-12 NOTE — Plan of Care (Signed)
  Problem: Elimination: Goal: Will not experience complications related to bowel motility Outcome: Progressing Goal: Will not experience complications related to urinary retention Outcome: Progressing  Patient bolused with 500mL of LR at shift change @ 1930 due to low UOP. Since then has had 1656mL of intake of broth, water, [place]. Output currently at 300mL over 6hrs. Will continue to monitor.

## 2018-02-12 NOTE — Progress Notes (Signed)
Subjective: Postpartum Day 1: Cesarean Delivery Patient reports tolerating PO.  Has not voided on her own yet. Denies nausea/vomiting. Up ad lib.   Objective: Vital signs in last 24 hours: Temp:  [97.4 F (36.3 C)-98.5 F (36.9 C)] 98.5 F (36.9 C) (07/14 0414) Pulse Rate:  [55-83] 70 (07/14 0414) Resp:  [9-25] 17 (07/14 0414) BP: (99-123)/(47-80) 116/59 (07/14 0414) SpO2:  [96 %-100 %] 98 % (07/14 0414)  Physical Exam:  General: alert, cooperative and no distress Lochia: appropriate Uterine Fundus: firm Incision: no significant drainage DVT Evaluation: No evidence of DVT seen on physical exam. No cords or calf tenderness.  Recent Labs    02/10/18 0536 02/12/18 0545  HGB 11.4* 10.6*  HCT 33.3* 31.3*    Assessment/Plan: Status post Cesarean section. Doing well postoperatively.  Continue current care. Possible discharge POD#2 or #3 Breast and bottle feeding Outpatient circ Hbg stable  Caryl AdaJazma Anissa Abbs, DO 02/12/2018, 7:16 AM

## 2018-02-13 ENCOUNTER — Encounter (HOSPITAL_COMMUNITY): Payer: Self-pay | Admitting: *Deleted

## 2018-02-13 MED ORDER — OXYCODONE HCL 5 MG PO TABS: 5.0000 mg | ORAL_TABLET | ORAL | 0 refills | Status: DC | PRN

## 2018-02-13 MED ORDER — IBUPROFEN 600 MG PO TABS: 600.0000 mg | ORAL_TABLET | Freq: Four times a day (QID) | ORAL | 0 refills | Status: DC

## 2018-02-13 NOTE — Discharge Summary (Signed)
OB Discharge Summary     Patient Name: Brooke CurryBrooklyn A Dilling DOB: Oct 06, 1985 MRN: [id number]  Date of admission: 02/10/2018 Delivering MD: Duane LopeEURE, LUTHER H   Date of discharge: 02/13/2018  Admitting diagnosis: 41WKLS,BLEEDING Intrauterine pregnancy: 5159w3d     Secondary diagnosis:  Active Problems:   Post-dates pregnancy  Additional problems: cesarean section after failure to progress.      Discharge diagnosis: Term Pregnancy Delivered                                                                                                Post partum procedures:none  Augmentation: AROM, Pitocin and Foley Balloon  Complications: None  Hospital course:  Induction of Labor With Cesarean Section  32 y.o. yo G1P0000 at 5259w3d was admitted to the hospital 02/10/2018 for induction of labor. Patient had a labor course significant for failure to progress . The patient went for cesarean section due to Arrest of Dilation and Arrest of Descent, and delivered a Viable infant,02/11/2018  Membrane Rupture Time/Date: 8:46 PM ,02/10/2018   Details of operation can be found in separate operative Note.  Patient had an uncomplicated postpartum course. She is ambulating, tolerating a regular diet, passing flatus, and urinating well.  Patient is discharged home in stable condition on 02/13/18.                                    Physical exam  Vitals:   02/12/18 0414 02/12/18 1334 02/12/18 2110 02/13/18 0557  BP: (!) 116/59 (!) 138/53 136/78 (!) 129/51  Pulse: 70 86 84 86  Resp: 17 17 18    Temp: 98.5 F (36.9 C) 98.1 F (36.7 C) 98.3 F (36.8 C)   TempSrc: Oral Oral Oral   SpO2: 98%     Weight:      Height:       General: alert, cooperative and no distress Lochia: appropriate Uterine Fundus: firm Incision: Healing well with no significant drainage DVT Evaluation: No evidence of DVT seen on physical exam. Labs: Lab Results  Component Value Date   WBC 14.6 (H) 02/12/2018   HGB 10.6 (L) 02/12/2018   HCT  31.3 (L) 02/12/2018   MCV 88.9 02/12/2018   PLT 266 02/12/2018   CMP Latest Ref Rng & Units 02/12/2018  Creatinine 0.44 - 1.00 mg/dL 9.140.83    Discharge instruction: per After Visit Summary and "Baby and Me Booklet".  After visit meds:  Allergies as of 02/13/2018   No Known Allergies     Medication List    STOP taking these medications   acetaminophen 325 MG tablet Commonly known as:  TYLENOL   COMFORT FIT MATERNITY SUPP SM Misc   cyclobenzaprine 10 MG tablet Commonly known as:  FLEXERIL   PRENATE PIXIE 10-0.6-0.4-200 MG Caps     TAKE these medications   albuterol 108 (90 Base) MCG/ACT inhaler Commonly known as:  PROVENTIL HFA;VENTOLIN HFA Inhale 1-2 puffs into the lungs every 6 (six) hours as needed for wheezing or shortness of breath.   ibuprofen 600  MG tablet Commonly known as:  ADVIL,MOTRIN Take 1 tablet (600 mg total) by mouth every 6 (six) hours.   oxyCODONE 5 MG immediate release tablet Commonly known as:  Oxy IR/ROXICODONE Take 1 tablet (5 mg total) by mouth every 4 (four) hours as needed (pain scale 4-7).       Diet: routine diet  Activity: Advance as tolerated. Pelvic rest for 6 weeks.   Outpatient follow up:1 week Follow up Appt:No future appointments. Follow up Visit:No follow-ups on file.  Postpartum contraception: IUD   Newborn Data: Live born female  Birth Weight: 6 lb 10.9 oz (3030 g) APGAR: 9, 9  Newborn Delivery   Birth date/time:  02/11/2018 06:14:00 Delivery type:  C-Section, Low Transverse C-section categorization:  Primary     Baby Feeding: Breast Disposition:home with mother   02/13/2018 Sandre Kitty, MD

## 2018-02-13 NOTE — Lactation Note (Signed)
This note was copied from a baby's chart. Lactation Consultation Note  Patient Name: Brooke Bryant Today's Date: 02/13/2018 Reason for consult: Primapara  Mom has been primarily bottle feeding, but she still wants to provide breast milk to her infant. Mom reports + breast changes w/pregnancy. Hand expression was taught to Mom. Mom was pleased to see tiny drops of colostrum from her R breast.   I talked to Mom about maximizing her potential supply. I encouraged her to pump every time infant receives formula, explaining that she won't immediately see the results, but it will make a difference over the next couple of days.  Mom was shown how to assemble & use hand pump (double & single-mode) that was included in pump kit.   Matthias Hughs [facility] 02/13/2018, 12:43 PM

## 2018-02-13 NOTE — Discharge Instructions (Signed)

## 2018-02-14 ENCOUNTER — Encounter (HOSPITAL_COMMUNITY): Payer: Self-pay | Admitting: *Deleted

## 2018-02-27 ENCOUNTER — Ambulatory Visit (INDEPENDENT_AMBULATORY_CARE_PROVIDER_SITE_OTHER): Payer: 59 | Admitting: Obstetrics

## 2018-02-27 ENCOUNTER — Encounter: Payer: Self-pay | Admitting: Obstetrics

## 2018-02-27 DIAGNOSIS — G8918 Other acute postprocedural pain: Secondary | ICD-10-CM

## 2018-02-27 DIAGNOSIS — Z1389 Encounter for screening for other disorder: Secondary | ICD-10-CM

## 2018-02-27 DIAGNOSIS — O8600 Infection of obstetric surgical wound, unspecified: Secondary | ICD-10-CM

## 2018-02-27 MED ORDER — VITAFOL ULTRA 29-0.6-0.4-200 MG PO CAPS
1.0000 | ORAL_CAPSULE | Freq: Every day | ORAL | 4 refills | Status: DC
Start: 2018-02-27 — End: 2020-11-10

## 2018-02-27 MED ORDER — AMOXICILLIN-POT CLAVULANATE 875-125 MG PO TABS: 1.0000 | ORAL_TABLET | Freq: Two times a day (BID) | ORAL | 0 refills | Status: DC

## 2018-02-27 MED ORDER — IBUPROFEN 800 MG PO TABS: 800.0000 mg | ORAL_TABLET | Freq: Three times a day (TID) | ORAL | 5 refills | Status: DC | PRN

## 2018-02-27 NOTE — Progress Notes (Signed)
Pt here for 2 week incision check. Pt denies any concerns with incision.

## 2018-02-27 NOTE — Progress Notes (Signed)
Subjective:     Brooke Bryant is a 32 y.o. female who presents for a postpartum visit. She is 2 weeks postpartum following a low cervical transverse Cesarean section. I have fully reviewed the prenatal and intrapartum course. The delivery was at 41 gestational weeks. Outcome: repeat cesarean section, low transverse incision. Anesthesia: spinal. Postpartum course has been normal. Baby's course has been normal. Baby is feeding by bottle - unknown. Bleeding thin lochia. Bowel function is normal. Bladder function is normal. Patient is not sexually active. Contraception method is abstinence. Postpartum depression screening: negative.  Tobacco, alcohol and substance abuse history reviewed.  Adult immunizations reviewed including TDAP, rubella and varicella.  The following portions of the patient's history were reviewed and updated as appropriate: allergies, current medications, past family history, past medical history, past social history, past surgical history and problem list.  Review of Systems A comprehensive review of systems was negative.   Objective:    BP 139/78   Pulse 77   Wt (!) 328 lb 3.2 oz (148.9 kg)   Breastfeeding? No   BMI 54.62 kg/m   General:  alert and no distress   Breasts:  inspection negative, no nipple discharge or bleeding, no masses or nodularity palpable  Lungs: clear to auscultation bilaterally  Heart:  regular rate and rhythm, S1, S2 normal, no murmur, click, rub or gallop  Abdomen: soft, non-tender; bowel sounds normal; no masses,  no organomegaly and incision is clean, dry and intact.  Staples removed and steri-strips applied.    50% of 25 min visit spent on counseling and coordination of care.   Assessment:     1. Postpartum care following cesarean delivery  2. Cesarean wound infection Rx: - amoxicillin-clavulanate (AUGMENTIN) 875-125 MG tablet; Take 1 tablet by mouth 2 (two) times daily.  Dispense: 14 tablet; Refill: 0  3. Postoperative pain Rx: -  ibuprofen (ADVIL,MOTRIN) 800 MG tablet; Take 1 tablet (800 mg total) by mouth every 8 (eight) hours as needed.  Dispense: 30 tablet; Refill: 5   Plan:    1. Contraception: IUD 2. Continue PNV's 3. Follow up in: 4 weeks or as needed.   Healthy lifestyle practices reviewed   Brock BadHARLES A. Salsabeel Gorelick MD 02-27-2018

## 2018-03-27 ENCOUNTER — Other Ambulatory Visit: Payer: Self-pay | Admitting: Obstetrics

## 2018-03-27 ENCOUNTER — Ambulatory Visit (INDEPENDENT_AMBULATORY_CARE_PROVIDER_SITE_OTHER): Payer: 59 | Admitting: Obstetrics

## 2018-03-27 ENCOUNTER — Encounter: Payer: Self-pay | Admitting: Obstetrics

## 2018-03-27 DIAGNOSIS — Z3009 Encounter for other general counseling and advice on contraception: Secondary | ICD-10-CM

## 2018-03-27 DIAGNOSIS — Z6841 Body Mass Index (BMI) 40.0 and over, adult: Secondary | ICD-10-CM

## 2018-03-27 NOTE — Progress Notes (Signed)
Patient [facility]  [name].Brooke Bryant.Post Partum Exam  Brooke Bryant is a 32 y.o. [id number] female who presents for a postpartum visit. She is 6 weeks postpartum following a low cervical transverse Cesarean section. I have fully reviewed the prenatal and intrapartum course. The delivery was at 41.1 gestational weeks.  Anesthesia: epidural. Postpartum course has been good. Baby's course has been good. Baby is feeding by bottle - Lucien MonsGer[employer]. Bleeding staining only. Bowel function is normal. Bladder function is normal. Patient is not sexually active. Contraception method is none. Postpartum depression screening:neg  The following portions of the patient's history were reviewed and updated as appropriate: allergies, current medications, past family history, past medical history, past social history, past surgical history and problem list. Last pap smear done 2018 and was Normal  Review of Systems A comprehensive review of systems was negative.    Objective:  not currently breastfeeding.  General:  alert and no distress   Breasts:  inspection negative, no nipple discharge or bleeding, no masses or nodularity palpable  Lungs: clear to auscultation bilaterally  Heart:  regular rate and rhythm, S1, S2 normal, no murmur, click, rub or gallop  Abdomen: soft, non-tender; bowel sounds normal; no masses,  no organomegaly and incision is clean, dry and intact   Assessment:    1. Postpartum care following cesarean delivery - doing well  2. Encounter for other general counseling and advice on contraception - has heavy, prolonged and painful periods - wants Mirena IUD  3. BMI 45.0-49.9, adult (HCC) - program of caloric reduction, exercise and diet recommended   Plan:   1. Contraception: IUD - Mirena 2. Mirena IUD Rx 3. Follow up in: several weeks, with onset of menses, for Mirena IUD Insertion   Brock BadHARLES A. HARPER MD 03-27-2018

## 2018-04-05 ENCOUNTER — Telehealth: Payer: Self-pay

## 2018-04-05 NOTE — Telephone Encounter (Signed)
Returned call and advised patient we would fax return to work note with restrictions.

## 2018-04-07 ENCOUNTER — Telehealth: Payer: Self-pay

## 2018-04-07 NOTE — Telephone Encounter (Signed)
Returned call, faxed letter to state when work restrictions may be lifted, per provider's orders.

## 2018-04-17 ENCOUNTER — Encounter: Payer: Self-pay | Admitting: Obstetrics

## 2018-04-17 ENCOUNTER — Ambulatory Visit (INDEPENDENT_AMBULATORY_CARE_PROVIDER_SITE_OTHER): Payer: 59 | Admitting: Obstetrics

## 2018-04-17 VITALS — BP 136/84 | HR 74 | Ht 65.0 in | Wt 337.4 lb

## 2018-04-17 DIAGNOSIS — Z30430 Encounter for insertion of intrauterine contraceptive device: Secondary | ICD-10-CM | POA: Diagnosis not present

## 2018-04-17 DIAGNOSIS — Z3202 Encounter for pregnancy test, result negative: Secondary | ICD-10-CM | POA: Diagnosis not present

## 2018-04-17 LAB — POCT URINE PREGNANCY: PREG TEST UR: NEGATIVE

## 2018-04-17 MED ORDER — LEVONORGESTREL 20 MCG/24HR IU IUD
INTRAUTERINE_SYSTEM | Freq: Once | INTRAUTERINE | Status: AC
  Administered 2018-04-17: 12:00:00 via INTRAUTERINE

## 2018-04-17 NOTE — Progress Notes (Signed)
Pt presents for IUD insertion.  

## 2018-04-17 NOTE — Progress Notes (Signed)
IUD Insertion Procedure Note  Pre-operative Diagnosis: Desires Long Acting Reversible Contraception ( LARC )  Post-operative Diagnosis: same  Indications: contraception  Procedure Details  Urine pregnancy test was done in office and result was negative.  The risks (including infection, bleeding, pain, and uterine perforation) and benefits of the procedure were explained to the patient and Written informed consent was obtained.   Cervix cleansed with Betadine. Uterus sounded to 7 cm. IUD inserted without difficulty. String visible and trimmed. Patient tolerated procedure well.  IUD Information: Mirena, Lot # [id number], Expiration date:  NOV  2021.  Condition: Stable  Complications: None  Plan:  The patient was advised to call for any fever or for prolonged or severe pain or bleeding. She was advised to use NSAID as needed for mild to moderate pain.   Attending Physician Documentation: I was present for or participated in the entire procedure, including opening and closing.   Brock BadHARLES A. Jaylei Fuerte MD 04-17-2018

## 2018-04-17 NOTE — Addendum Note (Signed)
Addended by: Hamilton CapriBURCH, Morry Veiga J on: 04/17/2018 12:06 PM   Modules accepted: Orders

## 2018-05-29 ENCOUNTER — Ambulatory Visit: Payer: 59 | Admitting: Obstetrics

## 2018-05-30 ENCOUNTER — Ambulatory Visit (INDEPENDENT_AMBULATORY_CARE_PROVIDER_SITE_OTHER): Payer: 59 | Admitting: Obstetrics

## 2018-05-30 ENCOUNTER — Encounter: Payer: Self-pay | Admitting: Obstetrics

## 2018-05-30 VITALS — BP 132/83 | HR 80 | Wt 332.0 lb

## 2018-05-30 DIAGNOSIS — Z975 Presence of (intrauterine) contraceptive device: Secondary | ICD-10-CM

## 2018-05-30 DIAGNOSIS — Z30431 Encounter for routine checking of intrauterine contraceptive device: Secondary | ICD-10-CM | POA: Diagnosis not present

## 2018-05-30 NOTE — Progress Notes (Signed)
RGYN pt here to F/U from Mirena IUD insertion on 04/17/2018.   CC: none   LMP: unsure due to spotting    Last pap : 07/19/17 WNL

## 2018-05-30 NOTE — Progress Notes (Signed)
Subjective:    Brooke Bryant is a 32 y.o. female who presents for contraception counseling. The patient has no complaints today. The patient is sexually active. Pertinent past medical history: none.  The information documented in the HPI was reviewed and verified.  Menstrual History: OB History    Gravida  1   Para  1   Term  1   Preterm  0   AB  0   Living  1     SAB  0   TAB  0   Ectopic  0   Multiple  0   Live Births  1            No LMP recorded.   Patient Active Problem List   Diagnosis Date Noted  . Post-dates pregnancy 02/10/2018  . BMI 45.0-49.9, adult (HCC) 08/02/2017  . Supervision of normal pregnancy, antepartum 07/18/2017   Past Medical History:  Diagnosis Date  . Arthritis   . Asthma   . Headache     Past Surgical History:  Procedure Laterality Date  . CESAREAN SECTION N/A 02/11/2018   Procedure: CESAREAN SECTION;  Surgeon: Lazaro Arms, MD;  Location: [facility] BIRTHING SUITES;  Service: Obstetrics;  Laterality: N/A;  . GUM SURGERY    . KNEE SURGERY Left      Current Outpatient Medications:  .  albuterol (PROVENTIL HFA;VENTOLIN HFA) 108 (90 Base) MCG/ACT inhaler, Inhale 1-2 puffs into the lungs every 6 (six) hours as needed for wheezing or shortness of breath., Disp: , Rfl:  .  ibuprofen (ADVIL,MOTRIN) 800 MG tablet, Take 1 tablet (800 mg total) by mouth every 8 (eight) hours as needed., Disp: 30 tablet, Rfl: 5 .  Prenat-Fe Poly-Methfol-FA-DHA (VITAFOL ULTRA) 29-0.6-0.4-200 MG CAPS, Take 1 capsule by mouth daily before breakfast. (Patient not taking: Reported on 05/30/2018), Disp: 90 capsule, Rfl: 4 No Known Allergies  Social History   Tobacco Use  . Smoking status: Former Smoker    Last attempt to quit: 01/21/2004    Years since quitting: 14.3  . Smokeless tobacco: Never Used  Substance Use Topics  . Alcohol use: Yes    Frequency: Never    Comment: occ    Family History  Problem Relation Age of Onset  . Depression Mother   .  Varicose Veins Mother   . Heart disease Father   . Hypertension Father   . Cancer Maternal Aunt   . Arthritis Maternal Grandfather   . Cancer Maternal Grandfather        Review of Systems Constitutional: negative for weight loss Genitourinary:negative for abnormal menstrual periods and vaginal discharge   Objective:   BP 132/83   Pulse 80   Wt (!) 332 lb (150.6 kg)   Breastfeeding? No   BMI 55.25 kg/m   PE:          General:  Alert and no distress    Abdomen:  normal findings: no organomegaly, soft, non-tender and no hernia  Pelvis:  External genitalia: normal general appearance Urinary system: urethral meatus normal and bladder without fullness, nontender Vaginal: normal without tenderness, induration or masses Cervix: normal appearance Adnexa: normal bimanual exam Uterus: anteverted and non-tender, normal size   Lab Review Urine pregnancy test Labs reviewed yes Radiologic studies reviewed no  50% of 15 min visit spent on counseling and coordination of care.    Assessment:    32 y.o., continuing Mirena IUD, no contraindications.   Plan:    All questions answered. Contraception: IUD. Discussed healthy  lifestyle modifications. Follow up in 1 month.   No orders of the defined types were placed in this encounter.  No orders of the defined types were placed in this encounter.   Brock Bad MD 05-30-2018

## 2018-07-24 ENCOUNTER — Ambulatory Visit: Payer: 59 | Admitting: Obstetrics

## 2018-08-09 ENCOUNTER — Ambulatory Visit (INDEPENDENT_AMBULATORY_CARE_PROVIDER_SITE_OTHER): Payer: BLUE CROSS/BLUE SHIELD | Admitting: Obstetrics

## 2018-08-09 ENCOUNTER — Other Ambulatory Visit: Payer: Self-pay

## 2018-08-09 ENCOUNTER — Encounter: Payer: Self-pay | Admitting: Obstetrics

## 2018-08-09 VITALS — BP 139/79 | HR 82 | Ht 65.0 in | Wt 332.4 lb

## 2018-08-09 DIAGNOSIS — Z30431 Encounter for routine checking of intrauterine contraceptive device: Secondary | ICD-10-CM

## 2018-08-09 DIAGNOSIS — Z124 Encounter for screening for malignant neoplasm of cervix: Secondary | ICD-10-CM | POA: Diagnosis not present

## 2018-08-09 DIAGNOSIS — Z01419 Encounter for gynecological examination (general) (routine) without abnormal findings: Secondary | ICD-10-CM

## 2018-08-09 DIAGNOSIS — Z113 Encounter for screening for infections with a predominantly sexual mode of transmission: Secondary | ICD-10-CM

## 2018-08-09 DIAGNOSIS — N898 Other specified noninflammatory disorders of vagina: Secondary | ICD-10-CM | POA: Diagnosis not present

## 2018-08-09 DIAGNOSIS — Z1151 Encounter for screening for human papillomavirus (HPV): Secondary | ICD-10-CM | POA: Diagnosis not present

## 2018-08-09 NOTE — Progress Notes (Signed)
Subjective:        Brooke Bryant is a 33 y.o. female here for a routine exam.  Current complaints: None.    Personal health questionnaire:  Is patient [name], have a family history of breast and/or ovarian cancer: no Is there a family history of uterine cancer diagnosed at age < 26, gastrointestinal cancer, urinary tract cancer, family member who is a Personnel officer syndrome-associated carrier: no Is the patient overweight and hypertensive, family history of diabetes, personal history of gestational diabetes, preeclampsia or PCOS: no Is patient over 23, have PCOS,  family history of premature CHD under age 41, diabetes, smoke, have hypertension or peripheral artery disease:  no At any time, has a partner hit, kicked or otherwise hurt or frightened you?: no Over the past 2 weeks, have you felt down, depressed or hopeless?: no Over the past 2 weeks, have you felt little interest or pleasure in doing things?:no   Gynecologic History Patient's last menstrual period was 07/13/2018 (exact date). Contraception: IUD Last Pap: 2018. Results were: normal Last mammogram: n/a. Results were: n/a  Obstetric History OB History  Gravida Para Term Preterm AB Living  1 1 1  0 0 1  SAB TAB Ectopic Multiple Live Births  0 0 0 0 1    # Outcome Date GA Lbr Len/2nd Weight Sex Delivery Anes PTL Lv  1 Term 02/11/18 [redacted]w[redacted]d  6 lb 10.9 oz (3.03 kg) M CS-LTranv EPI  LIV    Past Medical History:  Diagnosis Date  . Arthritis   . Asthma   . Headache     Past Surgical History:  Procedure Laterality Date  . CESAREAN SECTION N/A 02/11/2018   Procedure: CESAREAN SECTION;  Surgeon: Lazaro Arms, MD;  Location: [facility] BIRTHING SUITES;  Service: Obstetrics;  Laterality: N/A;  . GUM SURGERY    . KNEE SURGERY Left      Current Outpatient Medications:  .  albuterol (PROVENTIL HFA;VENTOLIN HFA) 108 (90 Base) MCG/ACT inhaler, Inhale 1-2 puffs into the lungs every 6 (six) hours as needed for wheezing or  shortness of breath., Disp: , Rfl:  .  ibuprofen (ADVIL,MOTRIN) 800 MG tablet, Take 1 tablet (800 mg total) by mouth every 8 (eight) hours as needed., Disp: 30 tablet, Rfl: 5 .  Prenat-Fe Poly-Methfol-FA-DHA (VITAFOL ULTRA) 29-0.6-0.4-200 MG CAPS, Take 1 capsule by mouth daily before breakfast. (Patient not taking: Reported on 05/30/2018), Disp: 90 capsule, Rfl: 4 No Known Allergies  Social History   Tobacco Use  . Smoking status: Former Smoker    Last attempt to quit: 01/21/2004    Years since quitting: 14.5  . Smokeless tobacco: Never Used  Substance Use Topics  . Alcohol use: Yes    Frequency: Never    Comment: occ    Family History  Problem Relation Age of Onset  . Depression Mother   . Varicose Veins Mother   . Heart disease Father   . Hypertension Father   . Cancer Maternal Aunt   . Arthritis Maternal Grandfather   . Cancer Maternal Grandfather       Review of Systems  Constitutional: negative for fatigue and weight loss Respiratory: negative for cough and wheezing Cardiovascular: negative for chest pain, fatigue and palpitations Gastrointestinal: negative for abdominal pain and change in bowel habits Musculoskeletal:negative for myalgias Neurological: negative for gait problems and tremors Behavioral/Psych: negative for abusive relationship, depression Endocrine: negative for temperature intolerance    Genitourinary:negative for abnormal menstrual periods, genital lesions, hot flashes, sexual problems  and vaginal discharge Integument/breast: negative for breast lump, breast tenderness, nipple discharge and skin lesion(s)    Objective:       BP 139/79   Pulse 82   Ht 5\' 5"  (1.651 m)   Wt (!) 332 lb 6.4 oz (150.8 kg)   LMP 07/13/2018 (Exact Date)   BMI 55.31 kg/m  General:   alert  Skin:   no rash or abnormalities  Lungs:   clear to auscultation bilaterally  Heart:   regular rate and rhythm, S1, S2 normal, no murmur, click, rub or gallop  Breasts:   normal  without suspicious masses, skin or nipple changes or axillary nodes  Abdomen:  normal findings: no organomegaly, soft, non-tender and no hernia  Pelvis:  External genitalia: normal general appearance Urinary system: urethral meatus normal and bladder without fullness, nontender Vaginal: normal without tenderness, induration or masses Cervix: normal appearance Adnexa: normal bimanual exam Uterus: anteverted and non-tender, normal size   Lab Review Urine pregnancy test Labs reviewed yes Radiologic studies reviewed no  50% of 20 min visit spent on counseling and coordination of care.   Assessment:     1. Encounter for routine gynecological examination with Papanicolaou smear of cervix Rx: - Cytology - PAP( [place])  2. Vaginal discharge Rx: - Cervicovaginal ancillary only( [place])  3. IUD check up  doing well   Plan:    Education reviewed: calcium supplements, depression evaluation, low fat, low cholesterol diet, safe sex/STD prevention, self breast exams and weight bearing exercise. Contraception: IUD. Follow up in: 1 year.   No orders of the defined types were placed in this encounter.  No orders of the defined types were placed in this encounter.   Brock Bad MD 08-09-2018

## 2018-08-09 NOTE — Progress Notes (Signed)
Presents for AEX/PAP/STD.

## 2018-08-11 LAB — CERVICOVAGINAL ANCILLARY ONLY
BACTERIAL VAGINITIS: NEGATIVE
CHLAMYDIA, DNA PROBE: NEGATIVE
Candida vaginitis: NEGATIVE
NEISSERIA GONORRHEA: NEGATIVE
TRICH (WINDOWPATH): NEGATIVE

## 2018-08-11 LAB — CYTOLOGY - PAP
Diagnosis: NEGATIVE
HPV: NOT DETECTED

## 2019-10-17 ENCOUNTER — Other Ambulatory Visit: Payer: Self-pay

## 2019-10-17 ENCOUNTER — Encounter: Payer: Self-pay | Admitting: Obstetrics

## 2019-10-17 ENCOUNTER — Ambulatory Visit (INDEPENDENT_AMBULATORY_CARE_PROVIDER_SITE_OTHER): Payer: BC Managed Care – PPO | Admitting: Obstetrics

## 2019-10-17 VITALS — BP 138/88 | HR 92 | Ht 65.0 in | Wt 334.3 lb

## 2019-10-17 DIAGNOSIS — Z6841 Body Mass Index (BMI) 40.0 and over, adult: Secondary | ICD-10-CM | POA: Diagnosis not present

## 2019-10-17 DIAGNOSIS — Z01419 Encounter for gynecological examination (general) (routine) without abnormal findings: Secondary | ICD-10-CM | POA: Diagnosis not present

## 2019-10-17 DIAGNOSIS — Z1151 Encounter for screening for human papillomavirus (HPV): Secondary | ICD-10-CM | POA: Diagnosis not present

## 2019-10-17 DIAGNOSIS — Z975 Presence of (intrauterine) contraceptive device: Secondary | ICD-10-CM | POA: Diagnosis not present

## 2019-10-17 DIAGNOSIS — Z1272 Encounter for screening for malignant neoplasm of vagina: Secondary | ICD-10-CM

## 2019-10-17 NOTE — Progress Notes (Signed)
Subjective:        Brooke Bryant is a 34 y.o. female here for a routine exam.  Current complaints: None.    Personal health questionnaire:  Is patient Brooke Bryant, have a family history of breast and/or ovarian cancer: no Is there a family history of uterine cancer diagnosed at age < 54, gastrointestinal cancer, urinary tract cancer, family member who is a Personnel officer syndrome-associated carrier: no Is the patient overweight and hypertensive, family history of diabetes, personal history of gestational diabetes, preeclampsia or PCOS: yes Is patient over 29, have PCOS,  family history of premature CHD under age 84, diabetes, smoke, have hypertension or peripheral artery disease:  no At any time, has a partner hit, kicked or otherwise hurt or frightened you?: no Over the past 2 weeks, have you felt down, depressed or hopeless?: no Over the past 2 weeks, have you felt little interest or pleasure in doing things?:no   Gynecologic History No LMP recorded. (Menstrual status: IUD). Contraception: IUD Last Pap: 08-09-2018. Results were: normal Last mammogram: n/a. Results were: n/a  Obstetric History OB History  Gravida Para Term Preterm AB Living  1 1 1  0 0 1  SAB TAB Ectopic Multiple Live Births  0 0 0 0 1    # Outcome Date GA Lbr Len/2nd Weight Sex Delivery Anes PTL Lv  1 Term 02/11/18 [redacted]w[redacted]d  6 lb 10.9 oz (3.03 kg) M CS-LTranv EPI  LIV    Past Medical History:  Diagnosis Date  . Arthritis   . Asthma   . Headache     Past Surgical History:  Procedure Laterality Date  . CESAREAN SECTION N/A 02/11/2018   Procedure: CESAREAN SECTION;  Surgeon: 02/13/2018, MD;  Location: [facility] BIRTHING SUITES;  Service: Obstetrics;  Laterality: N/A;  . GUM SURGERY    . KNEE SURGERY Left      Current Outpatient Medications:  .  albuterol (PROVENTIL HFA;VENTOLIN HFA) 108 (90 Base) MCG/ACT inhaler, Inhale 1-2 puffs into the lungs every 6 (six) hours as needed for wheezing or shortness of  breath., Disp: , Rfl:  .  ibuprofen (ADVIL,MOTRIN) 800 MG tablet, Take 1 tablet (800 mg total) by mouth every 8 (eight) hours as needed. (Patient not taking: Reported on 10/17/2019), Disp: 30 tablet, Rfl: 5 .  Prenat-Fe Poly-Methfol-FA-DHA (VITAFOL ULTRA) 29-0.6-0.4-200 MG CAPS, Take 1 capsule by mouth daily before breakfast. (Patient not taking: Reported on 05/30/2018), Disp: 90 capsule, Rfl: 4 No Known Allergies  Social History   Tobacco Use  . Smoking status: Former Smoker    Quit date: 01/21/2004    Years since quitting: 15.7  . Smokeless tobacco: Never Used  Substance Use Topics  . Alcohol use: Yes    Comment: occ    Family History  Problem Relation Age of Onset  . Depression Mother   . Varicose Veins Mother   . Heart disease Father   . Hypertension Father   . Cancer Maternal Aunt   . Arthritis Maternal Grandfather   . Cancer Maternal Grandfather       Review of Systems  Constitutional: negative for fatigue and weight loss Respiratory: negative for cough and wheezing Cardiovascular: negative for chest pain, fatigue and palpitations Gastrointestinal: negative for abdominal pain and change in bowel habits Musculoskeletal:negative for myalgias Neurological: negative for gait problems and tremors Behavioral/Psych: negative for abusive relationship, depression Endocrine: negative for temperature intolerance    Genitourinary:negative for abnormal menstrual periods, genital lesions, hot flashes, sexual problems and vaginal discharge  Integument/breast: negative for breast lump, breast tenderness, nipple discharge and skin lesion(s)    Objective:       BP 138/88   Pulse 92   Ht 5\' 5"  (1.651 m)   Wt (!) 334 lb 4.8 oz (151.6 kg)   BMI 55.63 kg/m  General:   alert  Skin:   no rash or abnormalities  Lungs:   clear to auscultation bilaterally  Heart:   regular rate and rhythm, S1, S2 normal, no murmur, click, rub or gallop  Breasts:   normal without suspicious masses, skin  or nipple changes or axillary nodes  Abdomen:  normal findings: no organomegaly, soft, non-tender and no hernia  Pelvis:  External genitalia: normal general appearance Urinary system: urethral meatus normal and bladder without fullness, nontender Vaginal: normal without tenderness, induration or masses Cervix: normal appearance Adnexa: normal bimanual exam Uterus: anteverted and non-tender, normal size   Lab Review Urine pregnancy test Labs reviewed yes Radiologic studies reviewed no  50% of 25 min visit spent on counseling and coordination of care.   Assessment:     1. Encounter for routine gynecological examination with Papanicolaou smear of cervix Rx: - Cytology - PAP( [place])  2. IUD (intrauterine device) in place - pleased with IUD  3. Class 3 severe obesity due to excess calories without serious comorbidity with body mass index (BMI) of 50.0 to 59.9 in adult [facility]) - program of caloric reduction, exercise and behavioral modification recommended    Plan:    Education reviewed: calcium supplements, depression evaluation, low fat, low cholesterol diet, safe sex/STD prevention, self breast exams and weight bearing exercise. Contraception: IUD. Follow up in: 1 year.   No orders of the defined types were placed in this encounter.  No orders of the defined types were placed in this encounter.   Shelly Bombard, MD 10/17/2019 3:47 PM

## 2019-10-17 NOTE — Progress Notes (Signed)
Pt is in the office for annual. Last pap 08-09-18 Pt currently has IUD in placed 04-17-18

## 2019-10-19 LAB — CYTOLOGY - PAP
Comment: NEGATIVE
Diagnosis: NEGATIVE
High risk HPV: NEGATIVE

## 2019-11-10 IMAGING — US US MFM OB DETAIL+14 WK
1 series · 14 of 28 positions shown · non-contrast
Comparison: none

[Series 1: us mfm ob detail+14 wk · 84 acquisitions, 14 frames shown]
[im 4/84]
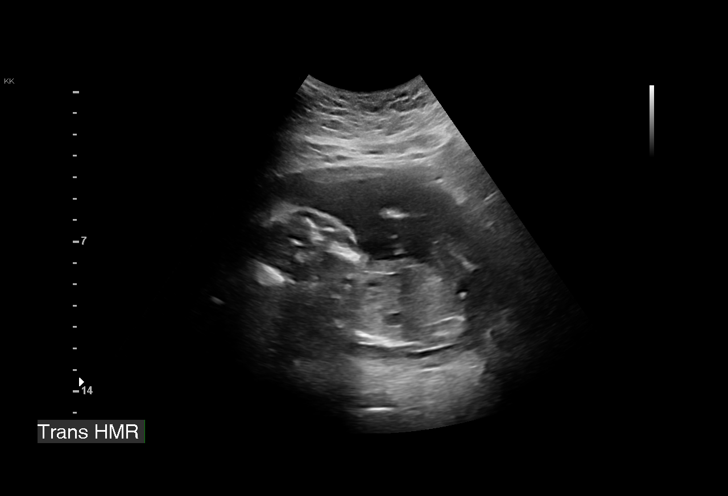
[im 10/84]
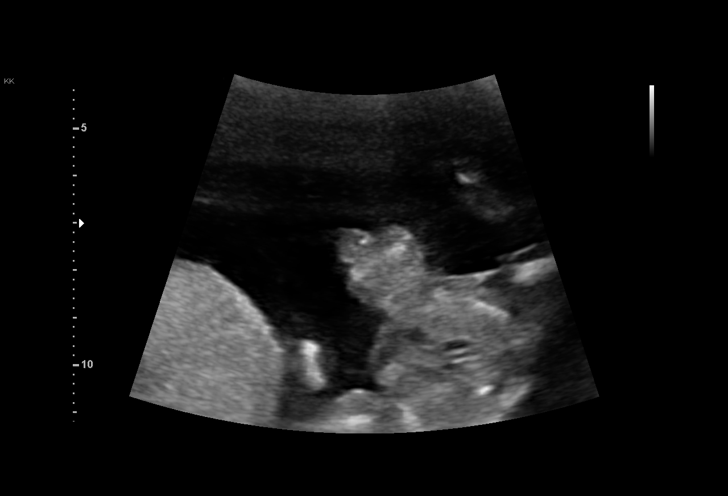
[im 16/84]
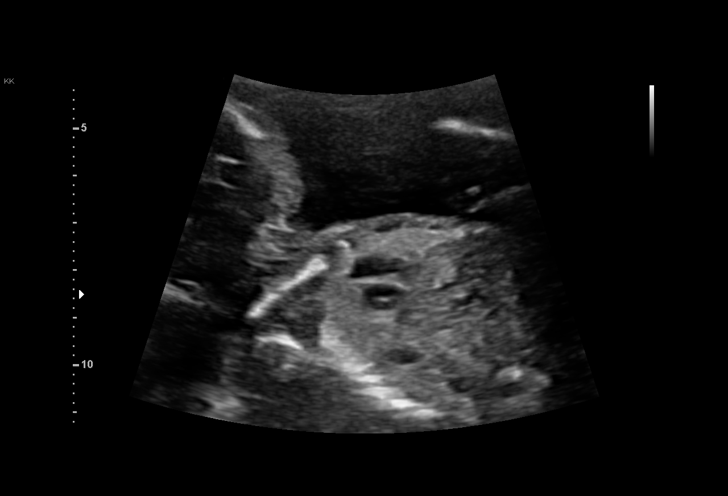
[im 22/84]
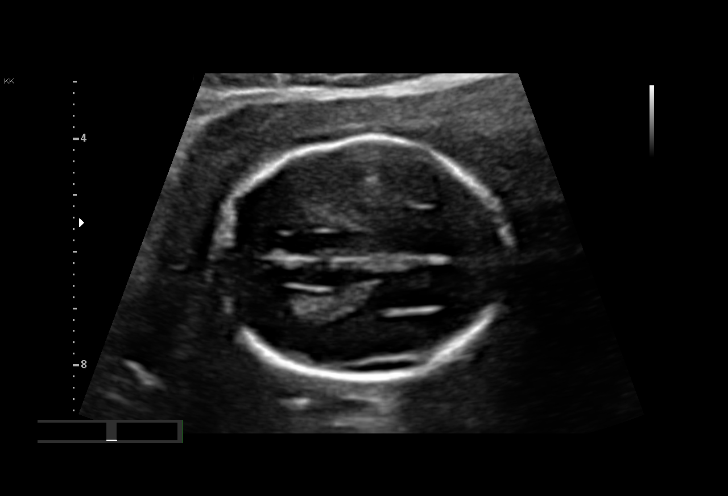
[im 28/84]
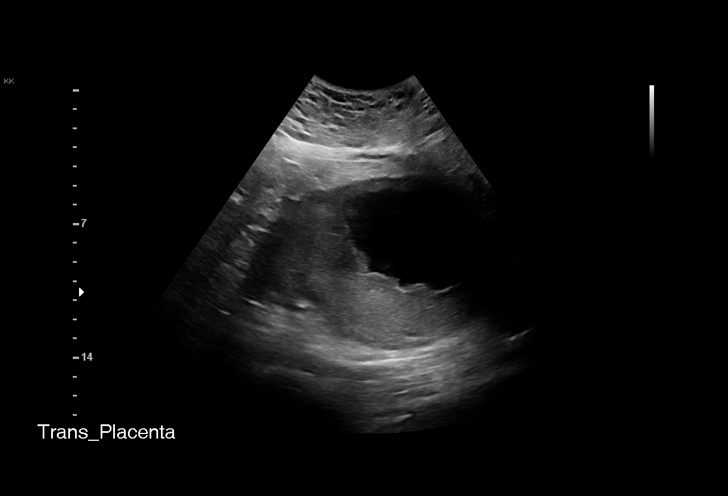
[im 34/84]
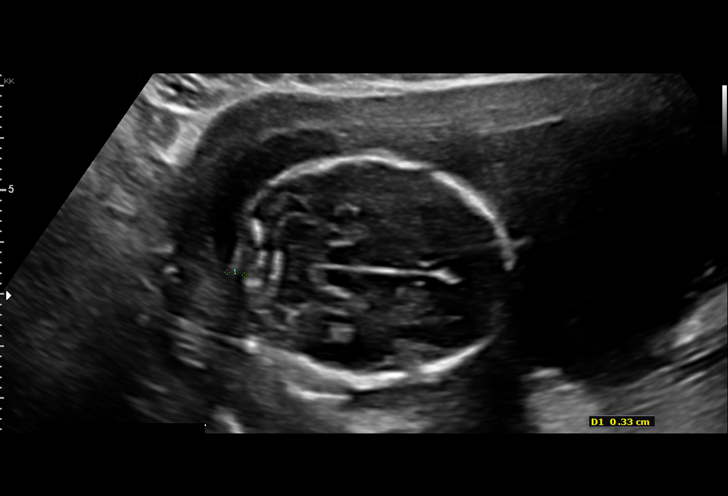
[im 40/84]
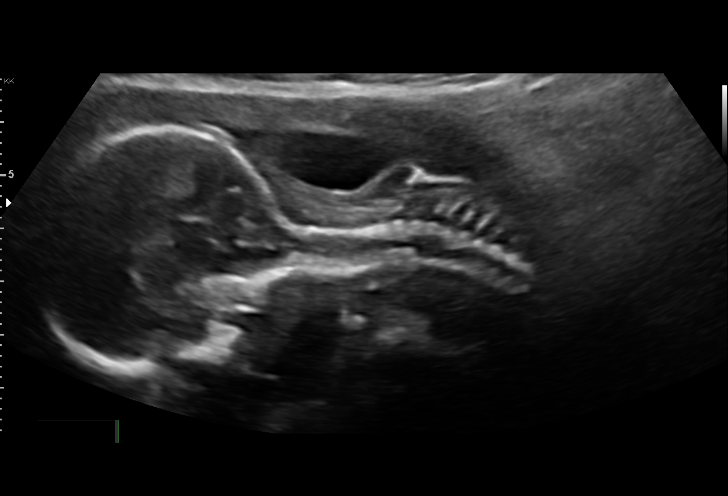
[im 47/84]
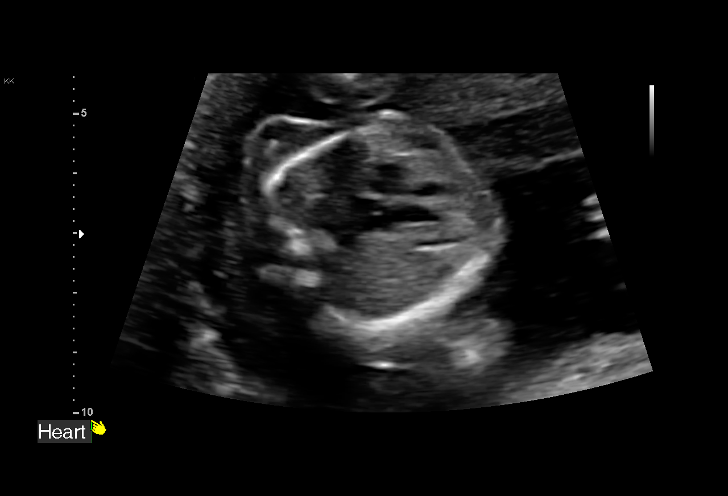
[im 53/84]
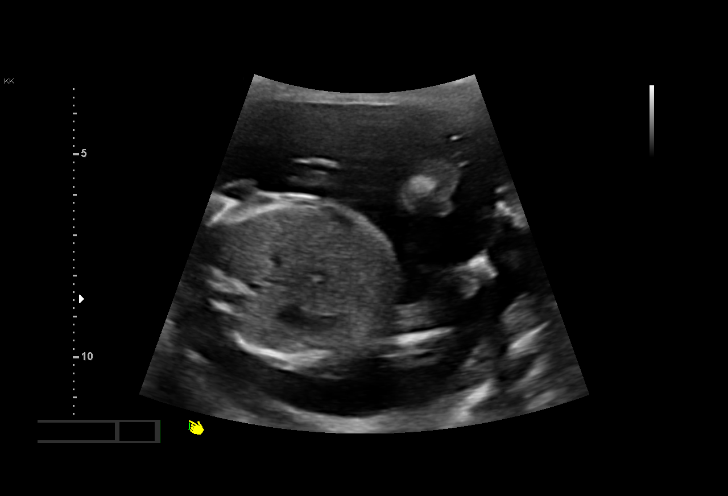
[im 59/84]
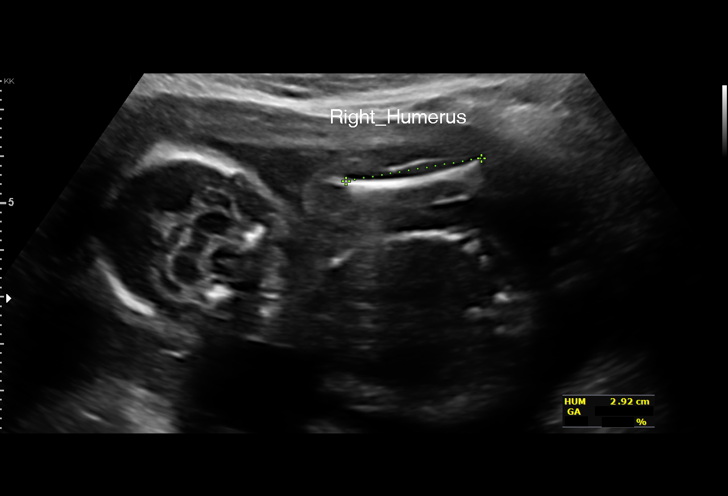
[im 65/84]
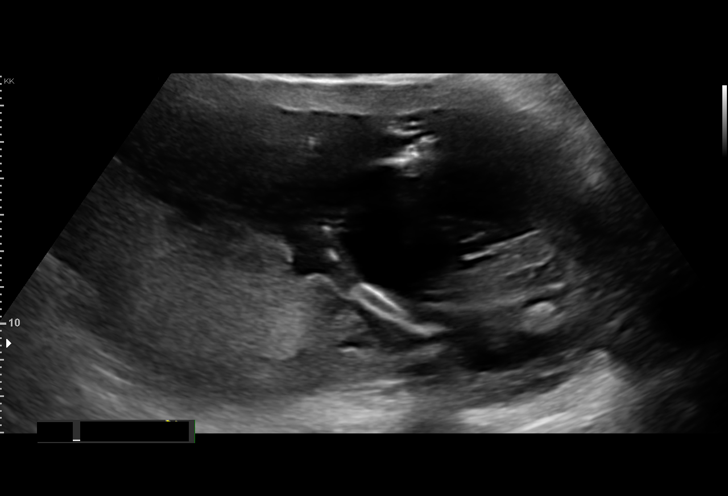
[im 71/84]
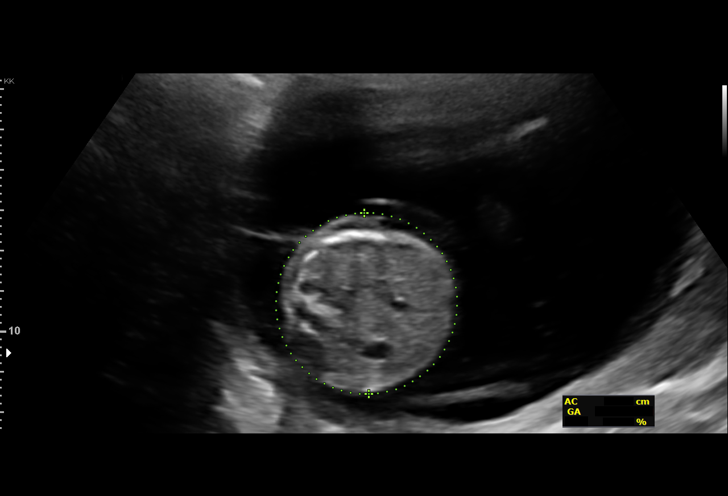
[im 77/84]
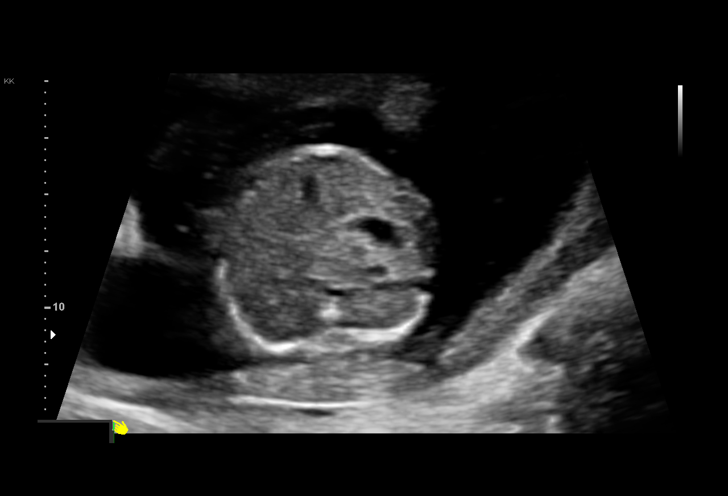
[im 84/84]
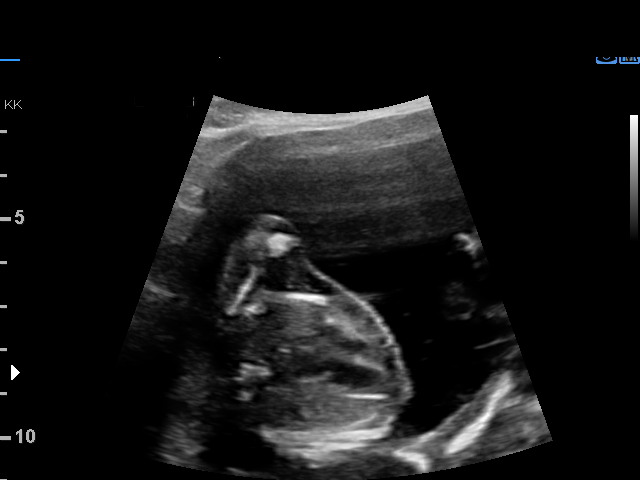

[14 of 28 positions shown; findings below may reference images not displayed]

Name:       PAULUS N CEEJAY                      Visit Date: 09/09/2017 [DATE]
[place]

[address]           [id number]      [id number]     [id number]
Indications

19 weeks gestation of pregnancy
Encounter for antenatal screening for
malformations
Obesity complicating pregnancy, second
trimester
OB History

Blood Type:            Height:  5'5"   Weight (lb):  290       BMI:
Gravidity:    1
Fetal Evaluation

Num Of Fetuses:     1
Fetal Heart         146
Rate(bpm):
Cardiac Activity:   Observed
Presentation:       Variable
Placenta:           Posterior, above cervical os
P. Cord Insertion:  Visualized

Amniotic Fluid
AFI FV:      Subjectively within normal limits

Largest Pocket(cm)
5.6
Biometry

BPD:        45  mm     G. Age:  19w 4d         76  %    CI:        80.51   %    70 - 86
FL/HC:      19.5   %    16.1 -
HC:      158.4  mm     G. Age:  18w 5d         29  %    HC/AC:      1.13        1.09 -
AC:      140.6  mm     G. Age:  19w 3d         60  %    FL/BPD:     68.7   %
FL:       30.9  mm     G. Age:  19w 4d         64  %    FL/AC:      22.0   %    20 - 24
HUM:      29.2  mm     G. Age:  19w 4d         64  %
NFT:       3.3  mm

Est. FW:     292  gm    0 lb 10 oz      52  %
Gestational Age

LMP:           19w 0d        Date:  04/29/17                 EDD:   02/03/18
U/S Today:     19w 2d                                        EDD:   02/01/18
Best:          19w 0d     Det. By:  LMP  (04/29/17)          EDD:   02/03/18
Anatomy

Cranium:               Appears normal         Aortic Arch:            Not well visualized
Cavum:                 Appears normal         Ductal Arch:            Not well visualized
Ventricles:            Appears normal         Diaphragm:              Appears normal
Choroid Plexus:        Appears normal         Stomach:                Appears normal, left
sided
Cerebellum:            Appears normal         Abdomen:                Appears normal
Posterior Fossa:       Appears normal         Abdominal Wall:         Not well visualized
Nuchal Fold:           Appears normal         Cord Vessels:           Appears normal (3
vessel cord)
Face:                  Appears normal         Kidneys:                Not well visualized
(orbits and profile)
Lips:                  Appears normal         Bladder:                Appears normal
Thoracic:              Appears normal         Spine:                  Not well visualized
Heart:                 Not well visualized    Upper Extremities:      Appears normal
RVOT:                  Not well visualized    Lower Extremities:      Appears normal
LVOT:                  Not well visualized

Other:  Male gender. Heels and 5th digit visualized. Technically difficult due
to maternal habitus and fetal position.
Cervix Uterus Adnexa

Cervix
Length:              3  cm.
Normal appearance by transabdominal scan.
Impression

Single living intrauterine pregnancy at 19w 0d.
Placenta Posterior, above cervical os.
Appropriate fetal growth.
Normal amniotic fluid volume.
The fetal anatomic survey is not complete.
No gross fetal anomalies identified.
No adnexal masses noted
Normal appearing cervical length
Recommendations

Recommend follow-up ultrasound examination in 
 4 weeks
for completion of fetal anatomic survey

## 2020-01-30 IMAGING — US US MFM OB FOLLOW-UP
1 series · 14 of 28 positions shown · non-contrast
Comparison: none

[Series 1: us mfm ob follow-up · 14 of 45 slices shown]
[im 2/45]
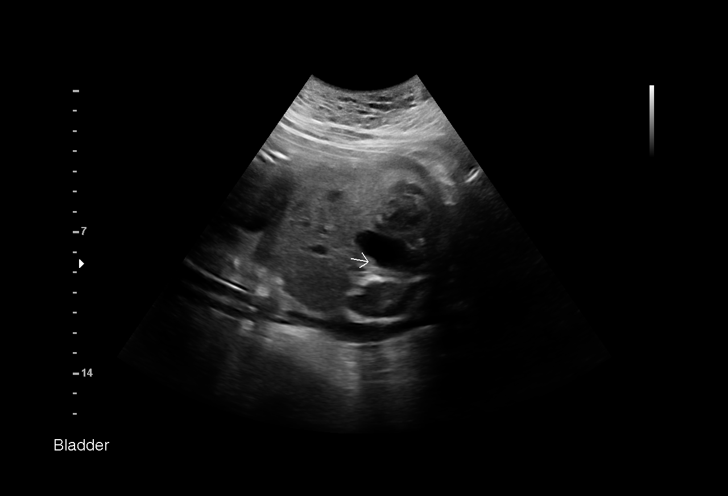
[im 5/45]
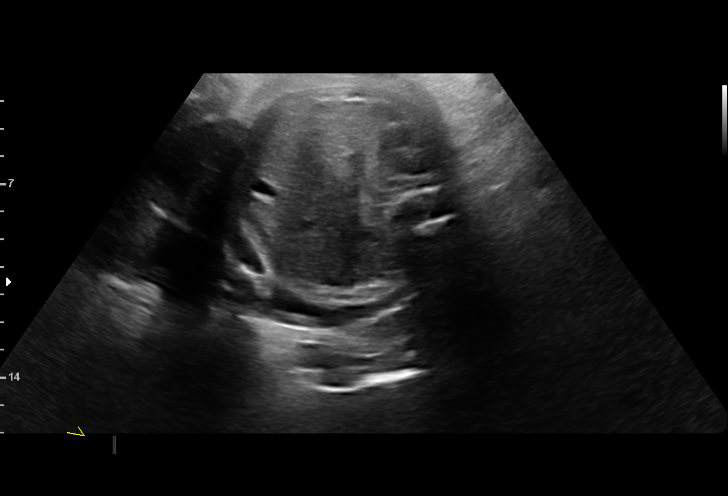
[im 9/45]
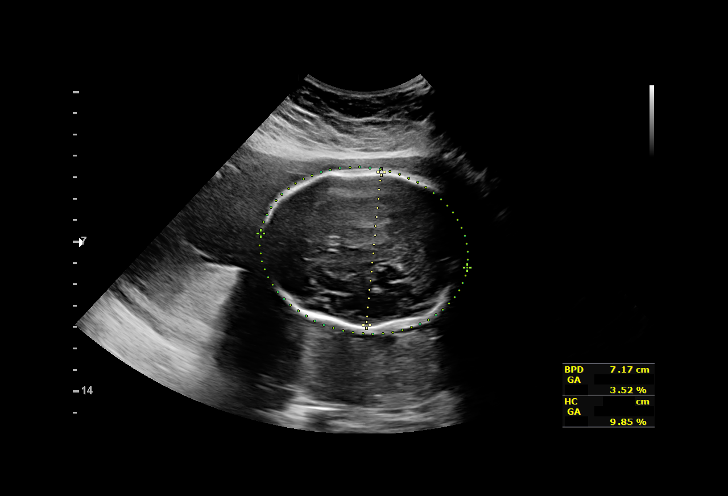
[im 12/45]
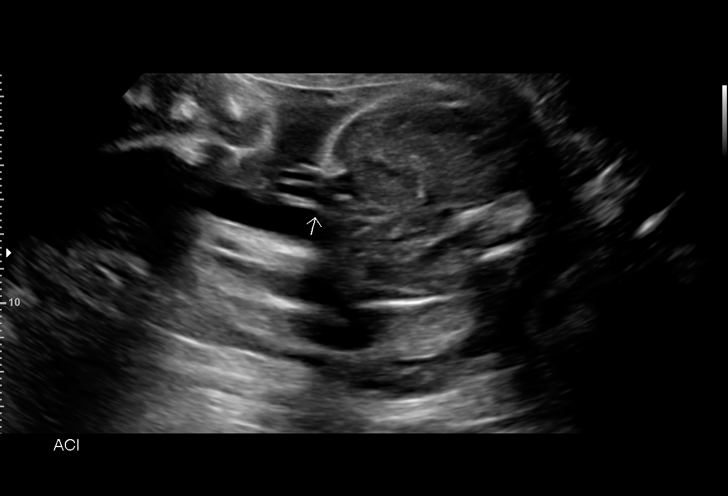
[im 15/45]
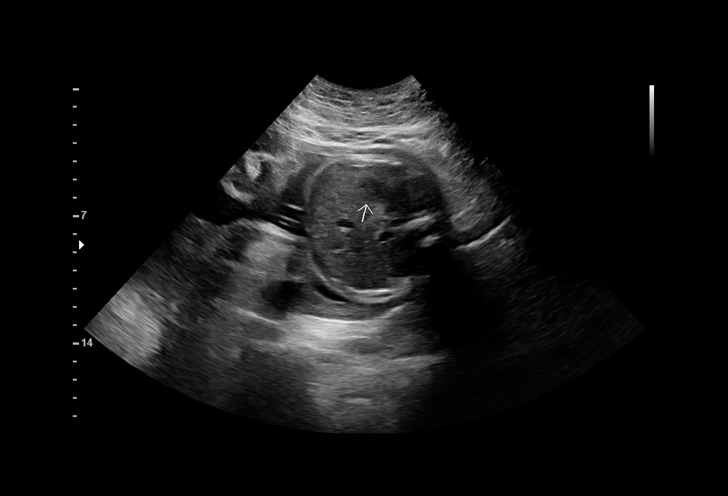
[im 18/45]
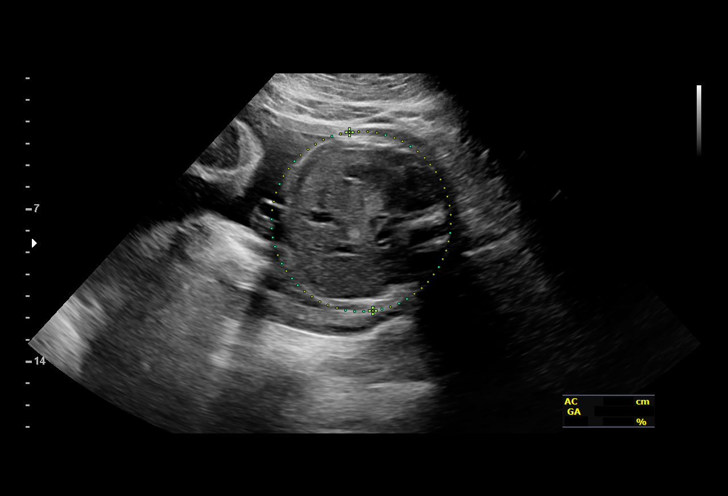
[im 22/45]
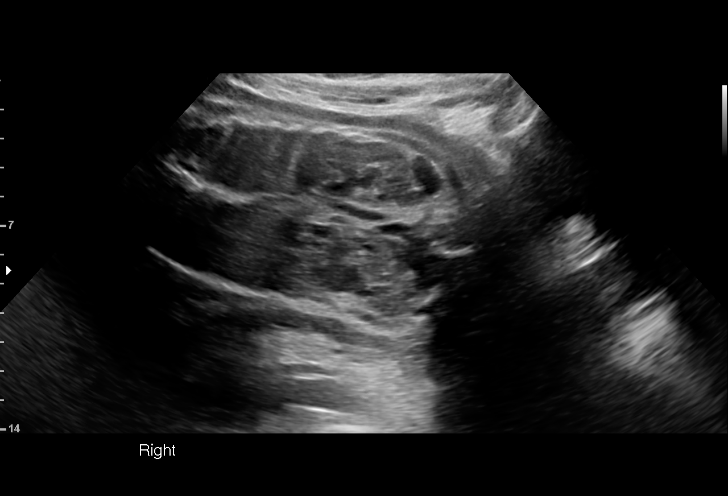
[im 25/45]
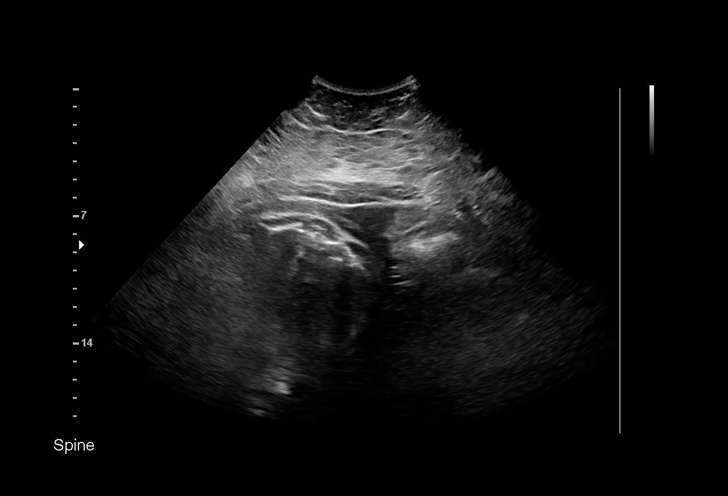
[im 28/45]
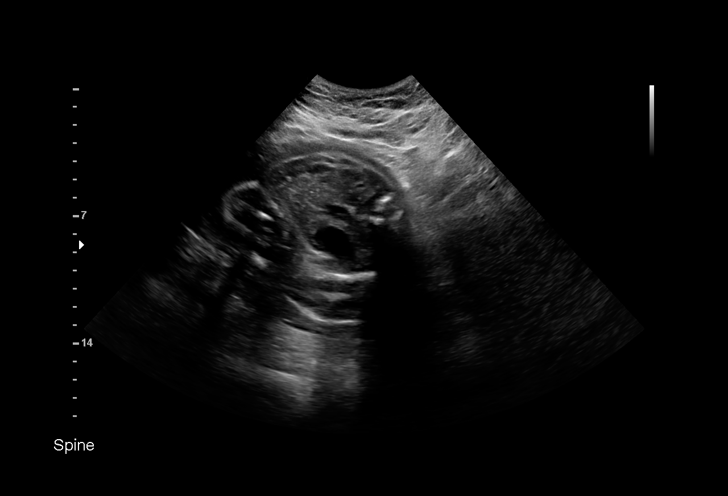
[im 31/45]
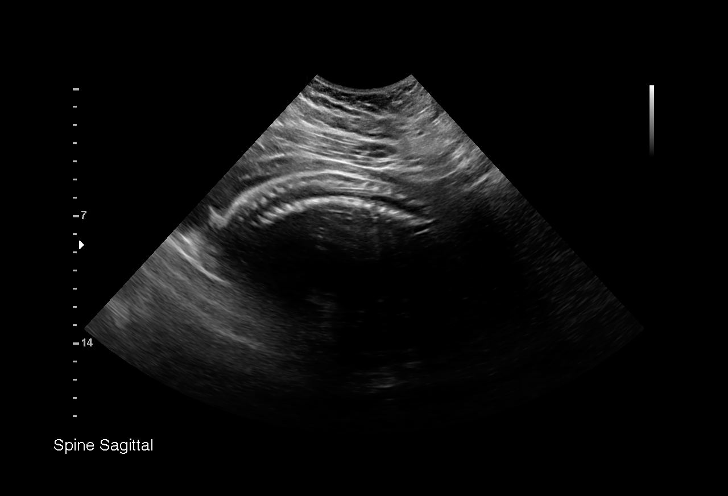
[im 35/45]
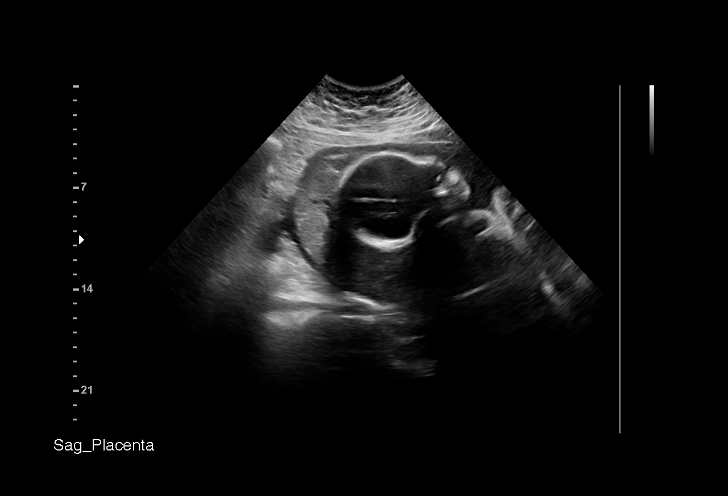
[im 38/45]
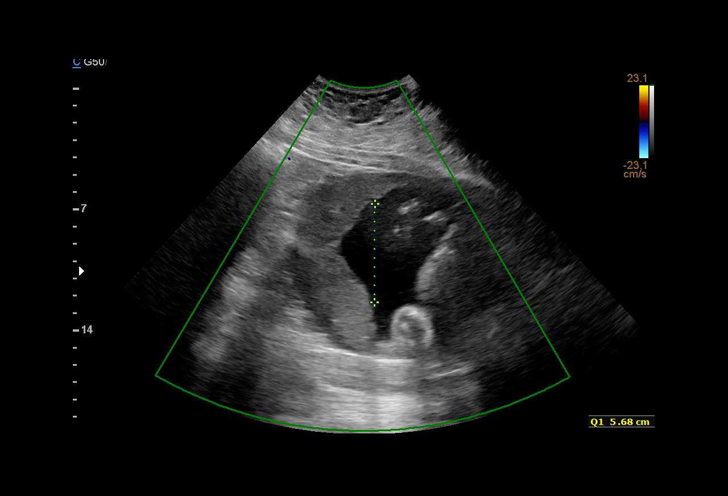
[im 41/45]
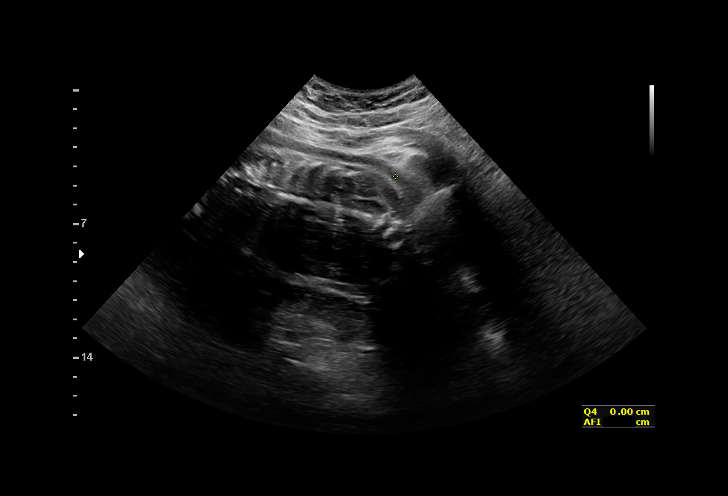
[im 45/45]
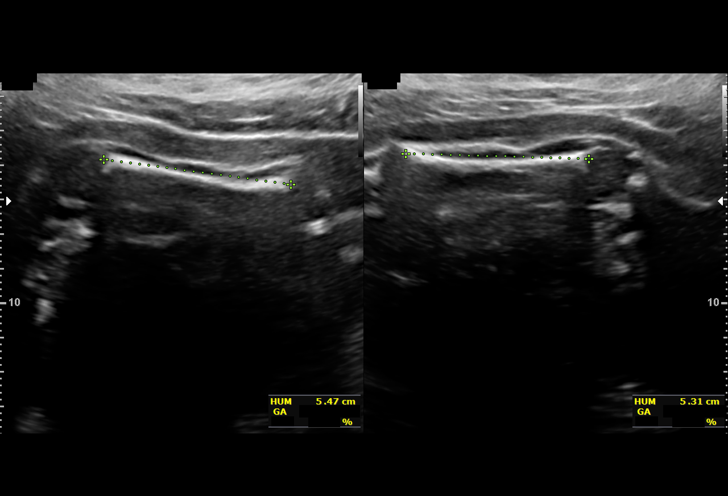

[14 of 28 positions shown; findings below may reference images not displayed]

Name:       XYU BANNIKOV                      Visit Date: 11/29/2017 [DATE]
[place]

1  [address] ILES           [id number]      [id number]     [id number]
Indications

30 weeks gestation of pregnancy
Obesity complicating pregnancy, third
trimester
Encounter for other antenatal screening
follow-up
OB History

Blood Type:            Height:  5'5"   Weight (lb):  290       BMI:
Gravidity:    1
Fetal Evaluation

Num Of Fetuses:     1
Fetal Heart         134
Rate(bpm):
Cardiac Activity:   Observed
Presentation:       Breech
Placenta:           Posterior, above cervical os
P. Cord Insertion:  Previously Visualized

Amniotic Fluid
AFI FV:      Subjectively within normal limits

AFI Sum(cm)     %Tile       Largest Pocket(cm)
10.5            18

RUQ(cm)       RLQ(cm)       LUQ(cm)        LLQ(cm)
5.68          0
Biometry

BPD:      72.1  mm     G. Age:  29w 0d          5  %    CI:        69.83   %    70 - 86
FL/HC:       21.1  %    19.3 -
HC:      275.3  mm     G. Age:  30w 0d          9  %    HC/AC:       1.06       0.96 -
AC:      260.1  mm     G. Age:  30w 1d         34  %    FL/BPD:      80.4  %    71 - 87
FL:         58  mm     G. Age:  30w 2d         29  %    FL/AC:       22.3  %    20 - 24
HUM:      54.1  mm     G. Age:  31w 3d         70  %

Est. FW:    4840   gm     3 lb 5 oz     45  %
Gestational Age

LMP:           30w 4d        Date:  04/29/17                 EDD:   02/03/18
U/S Today:     29w 6d                                        EDD:   02/08/18
Best:          30w 4d     Det. By:  LMP  (04/29/17)          EDD:   02/03/18
Anatomy

Cranium:               Appears normal         Aortic Arch:            Not well visualized
Cavum:                 Previously seen        Ductal Arch:            Not well visualized
Ventricles:            Appears normal         Diaphragm:              Previously seen
Choroid Plexus:        Previously seen        Stomach:                Appears normal, left
sided
Cerebellum:            Previously seen        Abdomen:                Previously seen
Posterior Fossa:       Previously seen        Abdominal Wall:         Appears nml (cord
insert, abd wall)
Nuchal Fold:           Previously seen        Cord Vessels:           Previously seen
Face:                  Orbits and profile     Kidneys:                Appear normal
previously seen
Lips:                  Previously seen        Bladder:                Appears normal
Thoracic:              Appears normal         Spine:                  Not well visualized
Heart:                 Not well visualized    Upper Extremities:      Previously seen
RVOT:                  Not well visualized    Lower Extremities:      Previously seen
LVOT:                  Not well visualized

Other:  Male gender prev seen. Heels and 5th digit prev visualized.
Technically difficult due to maternal habitus and fetal position.
Cervix Uterus Adnexa

Cervix
Not visualized (advanced GA >53wks)
Impression

Singleton intrauterine pregnancy at 30+4 weeks with obesity
here for completion of the anatomic survey
Review of the anatomy shows no sonographic markers for
aneuploidy or structural anomalies
However, views of the fetal heart and spine should still be
considered suboptimal secondary to maternal body habitus
and fetal position
Amniotic fluid volume is normal
Estimated fetal weight shows growth in the 45th percentile
Recommendations

It is unlikely further attempts to visualize all the anatomy will
be successful. Repeat scanning can be performed at your
discretion

## 2020-03-05 ENCOUNTER — Other Ambulatory Visit: Payer: Self-pay

## 2020-03-05 ENCOUNTER — Emergency Department (HOSPITAL_COMMUNITY)
Admission: EM | Admit: 2020-03-05 | Discharge: 2020-03-05 | Discharge status: Absent without leave | Payer: BC Managed Care – PPO

## 2020-11-10 ENCOUNTER — Encounter: Payer: Self-pay | Admitting: Physician Assistant

## 2020-11-10 ENCOUNTER — Other Ambulatory Visit: Payer: Self-pay | Admitting: Physician Assistant

## 2020-11-10 ENCOUNTER — Ambulatory Visit (INDEPENDENT_AMBULATORY_CARE_PROVIDER_SITE_OTHER): Payer: BC Managed Care – PPO

## 2020-11-10 ENCOUNTER — Other Ambulatory Visit: Payer: Self-pay

## 2020-11-10 ENCOUNTER — Ambulatory Visit (INDEPENDENT_AMBULATORY_CARE_PROVIDER_SITE_OTHER): Payer: BC Managed Care – PPO | Admitting: Physician Assistant

## 2020-11-10 VITALS — BP 120/76 | HR 80 | Temp 98.1°F | Ht 65.0 in | Wt 345.2 lb

## 2020-11-10 DIAGNOSIS — R002 Palpitations: Secondary | ICD-10-CM

## 2020-11-10 DIAGNOSIS — M546 Pain in thoracic spine: Secondary | ICD-10-CM | POA: Diagnosis not present

## 2020-11-10 DIAGNOSIS — R319 Hematuria, unspecified: Secondary | ICD-10-CM

## 2020-11-10 LAB — COMPREHENSIVE METABOLIC PANEL
ALT: 16 U/L (ref 0–35)
AST: 12 U/L (ref 0–37)
Albumin: 3.9 g/dL (ref 3.5–5.2)
Alkaline Phosphatase: 74 U/L (ref 39–117)
BUN: 13 mg/dL (ref 6–23)
CO2: 29 mEq/L (ref 19–32)
Calcium: 9.7 mg/dL (ref 8.4–10.5)
Chloride: 102 mEq/L (ref 96–112)
Creatinine, Ser: 0.73 mg/dL (ref 0.40–1.20)
GFR: 107.46 mL/min (ref >60.00)
Glucose, Bld: 68 mg/dL — ABNORMAL LOW (ref 70–99)
Potassium: 3.9 mEq/L (ref 3.5–5.1)
Sodium: 139 mEq/L (ref 135–145)
Total Bilirubin: 0.3 mg/dL (ref 0.2–1.2)
Total Protein: 6.7 g/dL (ref 6.0–8.3)

## 2020-11-10 LAB — POC URINALSYSI DIPSTICK (AUTOMATED)
Bilirubin, UA: NEGATIVE
Glucose, UA: NEGATIVE
Ketones, UA: NEGATIVE
Leukocytes, UA: NEGATIVE
Nitrite, UA: NEGATIVE
Protein, UA: POSITIVE — AB
Spec Grav, UA: 1.025 (ref 1.010–1.025)
Urobilinogen, UA: 0.2 E.U./dL
pH, UA: 6 (ref 5.0–8.0)

## 2020-11-10 LAB — CBC WITH DIFFERENTIAL/PLATELET
Basophils Absolute: 0 10*3/uL (ref 0.0–0.1)
Basophils Relative: 0.4 % (ref 0.0–3.0)
Eosinophils Absolute: 0.5 10*3/uL (ref 0.0–0.7)
Eosinophils Relative: 4 % (ref 0.0–5.0)
HCT: 41.8 % (ref 36.0–46.0)
Hemoglobin: 13.7 g/dL (ref 12.0–15.0)
Lymphocytes Relative: 20.1 % (ref 12.0–46.0)
Lymphs Abs: 2.4 10*3/uL (ref 0.7–4.0)
MCHC: 32.9 g/dL (ref 30.0–36.0)
MCV: 90.8 fl (ref 78.0–100.0)
Monocytes Absolute: 0.8 10*3/uL (ref 0.1–1.0)
Monocytes Relative: 6.8 % (ref 3.0–12.0)
Neutro Abs: 8.4 10*3/uL — ABNORMAL HIGH (ref 1.4–7.7)
Neutrophils Relative %: 68.7 % (ref 43.0–77.0)
Platelets: 285 10*3/uL (ref 150.0–400.0)
RBC: 4.6 Mil/uL (ref 3.87–5.11)
RDW: 13.3 % (ref 11.5–15.5)
WBC: 12.2 10*3/uL — ABNORMAL HIGH (ref 4.0–10.5)

## 2020-11-10 LAB — TSH: TSH: 2 u[IU]/mL (ref 0.35–4.50)

## 2020-11-10 MED ORDER — MELOXICAM 15 MG PO TABS: 15.0000 mg | ORAL_TABLET | Freq: Every day | ORAL | 0 refills | Status: DC

## 2020-11-10 NOTE — Patient Instructions (Signed)
It was great to see you!  For your back pain: please take 1 mobic tablet daily. This will replace any ibuprofen you may currently take. If no improvement or any worsening let me know. We will update your urine and blood work today.  For your palpitations: we will update EKG and check blood work today. We will have cardiology send you a zio patch (continuous EKG monitor patch) to wear continuous for a week or two to see if we can catch the rhythm and make sure its not anything of concerns.  I will be in touch with labs/urine results via mychart.  Take care,  Jarold Motto PA-C

## 2020-11-10 NOTE — Progress Notes (Signed)
Brooke Bryant is a 35 y.o. female is here to establish care and discuss issues.  I acted as a Neurosurgeon for [employer], PA-C Corky Mull, LPN   History of Present Illness:   Chief Complaint  Patient presents with  . Establish Care  . Palpitations  . Back Pain    HPI  Pt is here to establish care today.  Palpitations Pt c/o heart palpitations off and on x 1 month, lasts about 6 seconds. Can happen up to once a week. Has not checked her pulse during episodes. Denies SOB, chest pain, nausea, vomiting, LE swelling, stimulant use, dizziness, headaches. Coffee each morning for breakfast, just one cup daily. This is consistent intake for her. She doesn't note any significant change in stress level.  Had COVID in Feb 2020. No known inciting events. Can be with or without activity.  Back pain Pt c/o left mid back pain x 3 weeks. Pt has tried heating pad and tylenol with some relief. Denies urinary symptoms, trauma, hx kidney stones, fever, chills, unusual vaginal bleeding. Pain is worst after sitting for prolonged periods of time. She does note she recently started working out more.  Wt Readings from Last 4 Encounters:  11/10/20 (!) 345 lb 4 oz (156.6 kg)  10/17/19 (!) 334 lb 4.8 oz (151.6 kg)  08/09/18 (!) 332 lb 6.4 oz (150.8 kg)  05/30/18 (!) 332 lb (150.6 kg)    No LMP recorded. (Menstrual status: IUD).  Health Maintenance Due  Topic Date Due  . Hepatitis C Screening  Never done  . COVID-19 Vaccine (3 - Booster for [employer] series) 05/09/2020    Past Medical History:  Diagnosis Date  . Arthritis    Left knee     Social History   Tobacco Use  . Smoking status: Former Smoker    Packs/day: 1.00    Start date: 11/10/2001    Quit date: 08/02/2014    Years since quitting: 6.2  . Smokeless tobacco: Never Used  Vaping Use  . Vaping Use: Never used  Substance Use Topics  . Alcohol use: Yes    Alcohol/week: 2.0 standard drinks    Types: 2 Shots of liquor per  week  . Drug use: No    Past Surgical History:  Procedure Laterality Date  . CESAREAN SECTION N/A 02/11/2018   Procedure: CESAREAN SECTION;  Surgeon: Lazaro Arms, MD;  Location: [facility] BIRTHING SUITES;  Service: Obstetrics;  Laterality: N/A;  . GUM SURGERY    . KNEE SURGERY Left     Family History  Problem Relation Age of Onset  . Depression Mother   . Varicose Veins Mother   . Heart disease Father   . Hypertension Father   . Cancer Maternal Aunt   . Arthritis Maternal Grandfather   . Cancer Maternal Grandfather     PMHx, SurgHx, SocialHx, FamHx, Medications, and Allergies were reviewed in the Visit Navigator and updated as appropriate.   There are no problems to display for this patient.   Social History   Tobacco Use  . Smoking status: Former Smoker    Packs/day: 1.00    Start date: 11/10/2001    Quit date: 08/02/2014    Years since quitting: 6.2  . Smokeless tobacco: Never Used  Vaping Use  . Vaping Use: Never used  Substance Use Topics  . Alcohol use: Yes    Alcohol/week: 2.0 standard drinks    Types: 2 Shots of liquor per week  . Drug use: No  Current Medications and Allergies:    Current Outpatient Medications:  .  albuterol (PROVENTIL HFA;VENTOLIN HFA) 108 (90 Base) MCG/ACT inhaler, Inhale 1-2 puffs into the lungs every 6 (six) hours as needed for wheezing or shortness of breath., Disp: , Rfl:  .  meloxicam (MOBIC) 15 MG tablet, Take 1 tablet (15 mg total) by mouth daily., Disp: 30 tablet, Rfl: 0 .  OVER THE COUNTER MEDICATION, Take 2 each by mouth daily in the afternoon. Goli gummy dietary supplement, Disp: , Rfl:   No Known Allergies  Review of Systems   ROS Negative unless otherwise specified per HPI.  Vitals:   Vitals:   11/10/20 1330  BP: 120/76  Pulse: 80  Temp: 98.1 F (36.7 C)  TempSrc: Temporal  Weight: (!) 345 lb 4 oz (156.6 kg)  Height: 5\' 5"  (1.651 m)     Body mass index is 57.45 kg/m.   Physical Exam:    Physical  Exam Vitals and nursing note reviewed.  Constitutional:      General: She is not in acute distress.    Appearance: She is well-developed. She is not ill-appearing or toxic-appearing.  Cardiovascular:     Rate and Rhythm: Normal rate and regular rhythm.     Pulses: Normal pulses.     Heart sounds: Normal heart sounds, S1 normal and S2 normal.     Comments: No LE edema Pulmonary:     Effort: Pulmonary effort is normal.     Breath sounds: Normal breath sounds.  Musculoskeletal:     Comments: No decreased ROM 2/2 pain with flexion/extension, lateral side bends, or rotation. No reproducible tenderness with deep palpation to bilateral paraspinal muscles. No bony tenderness. No evidence of erythema, rash or ecchymosis. Negative STLR bilaterally.   Skin:    General: Skin is warm and dry.  Neurological:     Mental Status: She is alert.     GCS: GCS eye subscore is 4. GCS verbal subscore is 5. GCS motor subscore is 6.  Psychiatric:        Speech: Speech normal.        Behavior: Behavior normal. Behavior is cooperative.    Results for orders placed or performed in visit on 11/10/20  CBC with Differential/Platelet  Result Value Ref Range   WBC 12.2 (H) 4.0 - 10.5 K/uL   RBC 4.60 3.87 - 5.11 Mil/uL   Hemoglobin 13.7 12.0 - 15.0 g/dL   HCT 01/10/21 66.4 - 40.3 %   MCV 90.8 78.0 - 100.0 fl   MCHC 32.9 30.0 - 36.0 g/dL   RDW 47.4 25.9 - 56.3 %   Platelets 285.0 150.0 - 400.0 K/uL   Neutrophils Relative % 68.7 43.0 - 77.0 %   Lymphocytes Relative 20.1 12.0 - 46.0 %   Monocytes Relative 6.8 3.0 - 12.0 %   Eosinophils Relative 4.0 0.0 - 5.0 %   Basophils Relative 0.4 0.0 - 3.0 %   Neutro Abs 8.4 (H) 1.4 - 7.7 K/uL   Lymphs Abs 2.4 0.7 - 4.0 K/uL   Monocytes Absolute 0.8 0.1 - 1.0 K/uL   Eosinophils Absolute 0.5 0.0 - 0.7 K/uL   Basophils Absolute 0.0 0.0 - 0.1 K/uL  Comprehensive metabolic panel  Result Value Ref Range   Sodium 139 135 - 145 mEq/L   Potassium 3.9 3.5 - 5.1 mEq/L    Chloride 102 96 - 112 mEq/L   CO2 29 19 - 32 mEq/L   Glucose, Bld 68 (L) 70 - 99 mg/dL  BUN 13 6 - 23 mg/dL   Creatinine, Ser 8.29 0.40 - 1.20 mg/dL   Total Bilirubin 0.3 0.2 - 1.2 mg/dL   Alkaline Phosphatase 74 39 - 117 U/L   AST 12 0 - 37 U/L   ALT 16 0 - 35 U/L   Total Protein 6.7 6.0 - 8.3 g/dL   Albumin 3.9 3.5 - 5.2 g/dL   GFR 562.13 >08.65 mL/min   Calcium 9.7 8.4 - 10.5 mg/dL  TSH  Result Value Ref Range   TSH 2.00 0.35 - 4.50 uIU/mL  POCT Urinalysis Dipstick (Automated)  Result Value Ref Range   Color, UA yellow    Clarity, UA cloudy    Glucose, UA Negative Negative   Bilirubin, UA neg    Ketones, UA neg    Spec Grav, UA 1.025 1.010 - 1.025   Blood, UA 2+ 80 Ery/uL    pH, UA 6.0 5.0 - 8.0   Protein, UA Positive (A) Negative   Urobilinogen, UA 0.2 0.2 or 1.0 E.U./dL   Nitrite, UA neg    Leukocytes, UA Negative Negative      Assessment and Plan:    Gerre was seen today for establish care, palpitations and back pain.  Diagnoses and all orders for this visit:  Palpitations Labs unremarkable except for leukocytosis -- which she appears to have history of. Will recheck this with added smear for further evaluation. EKG tracing is personally reviewed.  EKG notes NSR.  No acute changes.  Will have patient wear zio patch for further evaluation. Worsening precautions advised. -     EKG 12-Lead -     CBC with Differential/Platelet -     Comprehensive metabolic panel -     TSH -     LONG TERM MONITOR (3-14 DAYS); Future  Acute left-sided thoracic back pain No red flags on exam. She does have blood in urine. Will repeat in a week. I did recommend urine pregnancy test but this was not done prior to patient leaving and before urine sample discarded -- will reach out to patient to perform at home or return to office. If negative UPT, may take mobic 15 mg daily. If no improvement, will consider imaging vs PT. -     POCT Urinalysis Dipstick (Automated)  Other  orders -     meloxicam (MOBIC) 15 MG tablet; Take 1 tablet (15 mg total) by mouth daily.   CMA or LPN served as scribe during this visit. History, Physical, and Plan performed by medical provider. The above documentation has been reviewed and is accurate and complete.   Jarold Motto, PA-C [place] [place] 11/10/2020  Follow-up: No follow-ups on file.

## 2020-11-12 ENCOUNTER — Other Ambulatory Visit: Payer: Self-pay | Admitting: Physician Assistant

## 2020-11-12 DIAGNOSIS — R002 Palpitations: Secondary | ICD-10-CM | POA: Diagnosis not present

## 2020-11-12 DIAGNOSIS — D72829 Elevated white blood cell count, unspecified: Secondary | ICD-10-CM

## 2020-11-26 ENCOUNTER — Other Ambulatory Visit: Payer: Self-pay

## 2020-11-26 ENCOUNTER — Other Ambulatory Visit (INDEPENDENT_AMBULATORY_CARE_PROVIDER_SITE_OTHER): Payer: BC Managed Care – PPO

## 2020-11-26 DIAGNOSIS — R319 Hematuria, unspecified: Secondary | ICD-10-CM

## 2020-11-26 DIAGNOSIS — D72829 Elevated white blood cell count, unspecified: Secondary | ICD-10-CM

## 2020-11-26 LAB — URINALYSIS, ROUTINE W REFLEX MICROSCOPIC
Bilirubin Urine: NEGATIVE
Ketones, ur: NEGATIVE
Leukocytes,Ua: NEGATIVE
Nitrite: NEGATIVE
Specific Gravity, Urine: 1.02 (ref 1.000–1.030)
Total Protein, Urine: NEGATIVE
Urine Glucose: NEGATIVE
Urobilinogen, UA: 2 — AB (ref 0.0–1.0)
pH: 6 (ref 5.0–8.0)

## 2020-11-26 LAB — CBC WITH DIFFERENTIAL/PLATELET
Basophils Absolute: 0.1 10*3/uL (ref 0.0–0.1)
Basophils Relative: 0.5 % (ref 0.0–3.0)
Eosinophils Absolute: 0.6 10*3/uL (ref 0.0–0.7)
Eosinophils Relative: 5.1 % — ABNORMAL HIGH (ref 0.0–5.0)
HCT: 38.5 % (ref 36.0–46.0)
Hemoglobin: 12.9 g/dL (ref 12.0–15.0)
Lymphocytes Relative: 18.3 % (ref 12.0–46.0)
Lymphs Abs: 2.3 10*3/uL (ref 0.7–4.0)
MCHC: 33.4 g/dL (ref 30.0–36.0)
MCV: 90.2 fl (ref 78.0–100.0)
Monocytes Absolute: 0.9 10*3/uL (ref 0.1–1.0)
Monocytes Relative: 6.8 % (ref 3.0–12.0)
Neutro Abs: 8.6 10*3/uL — ABNORMAL HIGH (ref 1.4–7.7)
Neutrophils Relative %: 69.3 % (ref 43.0–77.0)
Platelets: 283 10*3/uL (ref 150.0–400.0)
RBC: 4.27 Mil/uL (ref 3.87–5.11)
RDW: 13.7 % (ref 11.5–15.5)
WBC: 12.5 10*3/uL — ABNORMAL HIGH (ref 4.0–10.5)

## 2020-11-27 LAB — PATHOLOGIST SMEAR REVIEW

## 2020-11-28 ENCOUNTER — Other Ambulatory Visit: Payer: Self-pay | Admitting: *Deleted

## 2020-11-28 ENCOUNTER — Telehealth: Payer: Self-pay

## 2020-11-28 DIAGNOSIS — R799 Abnormal finding of blood chemistry, unspecified: Secondary | ICD-10-CM

## 2020-11-28 NOTE — Telephone Encounter (Signed)
See results note. 

## 2020-11-28 NOTE — Telephone Encounter (Signed)
Pt returned call from Lupita Leash about lab results

## 2020-12-01 ENCOUNTER — Telehealth: Payer: Self-pay | Admitting: Hematology

## 2020-12-01 NOTE — Telephone Encounter (Signed)
Received a new hem referral from [facility], [place] for leukocytosis. Brooke Bryant returned my call and has been scheduled to see Dr. Mosetta Putt on 5/9 at 3pm. Pt aware to arrive 20 minutes early.

## 2020-12-03 ENCOUNTER — Encounter: Payer: Self-pay | Admitting: Physician Assistant

## 2020-12-03 ENCOUNTER — Other Ambulatory Visit: Payer: Self-pay | Admitting: Physician Assistant

## 2020-12-03 DIAGNOSIS — I441 Atrioventricular block, second degree: Secondary | ICD-10-CM

## 2020-12-03 DIAGNOSIS — R002 Palpitations: Secondary | ICD-10-CM

## 2020-12-03 NOTE — Progress Notes (Signed)
[facility]   Telephone:([phone]) [phone] [phone]   Clinic New Consult Note   Patient Care Team: Jarold Motto, [place] as PCP - General (Physician Assistant)  Date of Service:  12/08/2020   CHIEF COMPLAINTS/PURPOSE OF CONSULTATION:  Leukocytosis  REFERRING PHYSICIAN:  PCP  HISTORY OF PRESENTING ILLNESS:  Brooke Bryant 35 y.o. female is a here because of leukocytosis. The patient presents to the clinic today alone.  Since 2018 her labs in Epic show mildly elevated WBC. She notes she did not know her WBC were elevated. She notes having allergies this spring but not severe. She will have sneezing, post nasal drip and cough. She denies having sinus infections or bronchitis. She notes h/o skin abscess in multiple locations, prior to having her son 3 years ago. She denies fever or night sweats. She denies drastic change in weight. She is trying to lose weight and has recently returned to gym. She overall feels at her baseline.   She has a PMHx of Arthritis in her left knee, allergies. After fall at age 20, she requires leg surgery. She had gum surgery as well. I reviewed her medication list with her. Her paternal aunt had breast cancer.   Socially she is married with 1 son. She smoked for 5 years 1ppd, quit in 2016. She notes she drinks about 3 shots a week. She denies recreational drug. She notes she works in a [employer].     REVIEW OF SYSTEMS:   Constitutional: Denies fevers, chills or abnormal night sweats Eyes: Denies blurriness of vision, double vision or watery eyes Ears, nose, mouth, throat, and face: Denies mucositis or sore throat Respiratory: Denies cough, dyspnea or wheezes Cardiovascular: Denies palpitation, chest discomfort or lower extremity swelling Gastrointestinal:  Denies nausea, heartburn or change in bowel habits Skin: Denies abnormal skin rashes Lymphatics: Denies new lymphadenopathy or easy bruising Neurological:Denies numbness, tingling  or new weaknesses Behavioral/Psych: Mood is stable, no new changes  All other systems were reviewed with the patient and are negative.   MEDICAL HISTORY:  Past Medical History:  Diagnosis Date  . Allergy   . Arthritis    Left knee    SURGICAL HISTORY: Past Surgical History:  Procedure Laterality Date  . CESAREAN SECTION N/A 02/11/2018   Procedure: CESAREAN SECTION;  Surgeon: Lazaro Arms, MD;  Location: [facility] BIRTHING SUITES;  Service: Obstetrics;  Laterality: N/A;  . GUM SURGERY    . KNEE SURGERY Left     SOCIAL HISTORY: Social History   Socioeconomic History  . Marital status: Married    Spouse name: Not on file  . Number of children: 1  . Years of education: Not on file  . Highest education level: Not on file  Occupational History  . Not on file  Tobacco Use  . Smoking status: Former Smoker    Packs/day: 1.00    Years: 5.00    Pack years: 5.00    Start date: 11/10/2001    Quit date: 08/02/2014    Years since quitting: 6.3  . Smokeless tobacco: Never Used  Vaping Use  . Vaping Use: Never used  Substance and Sexual Activity  . Alcohol use: Yes    Alcohol/week: 3.0 standard drinks    Types: 3 Shots of liquor per week  . Drug use: No  . Sexual activity: Yes    Partners: Male    Birth control/protection: I.U.D.    Comment: Mirena  Other Topics Concern  . Not on file  Social History  Narrative   Married 2018   Son in 2019   Works for [employer] part time   Social Determinants of Corporate investment banker Strain: Not on [employer] Insecurity: Not on file  Transportation Needs: Not on file  Physical Activity: Not on file  Stress: Not on file  Social Connections: Not on file  Intimate Partner Violence: Not on file    FAMILY HISTORY: Family History  Problem Relation Age of Onset  . Depression Mother   . Varicose Veins Mother   . Heart disease Father   . Hypertension Father   . Arthritis Maternal Grandfather   . Cancer Paternal Aunt        breast  cancer     ALLERGIES:  has No Known Allergies.  MEDICATIONS:  Current Outpatient Medications  Medication Sig Dispense Refill  . albuterol (PROVENTIL HFA;VENTOLIN HFA) 108 (90 Base) MCG/ACT inhaler Inhale 1-2 puffs into the lungs every 6 (six) hours as needed for wheezing or shortness of breath.    . meloxicam (MOBIC) 15 MG tablet Take 1 tablet (15 mg total) by mouth daily. 30 tablet 0  . OVER THE COUNTER MEDICATION Take 2 each by mouth daily in the afternoon. Goli gummy dietary supplement     No current facility-administered medications for this visit.    PHYSICAL EXAMINATION: ECOG PERFORMANCE STATUS: 0 - Asymptomatic  Vitals:   12/08/20 1458  BP: (!) 167/92  Pulse: 92  Resp: 18  Temp: 97.6 F (36.4 C)  SpO2: 98%   Filed Weights   12/08/20 1458  Weight: (!) 350 lb 14.4 oz (159.2 kg)    GENERAL:alert, no distress and comfortable SKIN: skin color, texture, turgor are normal, no rashes or significant lesions EYES: normal, Conjunctiva are pink and non-injected, sclera clear  NECK: supple, thyroid normal size, non-tender, without nodularity LYMPH:  no palpable lymphadenopathy in the cervical, axillary or inguinal LUNGS: clear to auscultation and percussion with normal breathing effort HEART: regular rate & rhythm and no murmurs and no lower extremity edema ABDOMEN:abdomen soft, non-tender and normal bowel sounds Musculoskeletal:no cyanosis of digits and no clubbing  NEURO: alert & oriented x 3 with fluent speech, no focal motor/sensory deficits  LABORATORY DATA:  I have reviewed the data as listed CBC Latest Ref Rng & Units 11/26/2020 11/10/2020 02/12/2018  WBC 4.0 - 10.5 K/uL 12.5(H) 12.2(H) 14.6(H)  Hemoglobin 12.0 - 15.0 g/dL 83.6 62.9 10.6(L)  Hematocrit 36.0 - 46.0 % 38.5 41.8 31.3(L)  Platelets 150.0 - 400.0 K/uL 283.0 285.0 266    CMP Latest Ref Rng & Units 11/10/2020 02/12/2018  Glucose 70 - 99 mg/dL 47(M) -  BUN 6 - 23 mg/dL 13 -  Creatinine 5.46 - 1.20 mg/dL  5.03 5.46  Sodium 568 - 145 mEq/L 139 -  Potassium 3.5 - 5.1 mEq/L 3.9 -  Chloride 96 - 112 mEq/L 102 -  CO2 19 - 32 mEq/L 29 -  Calcium 8.4 - 10.5 mg/dL 9.7 -  Total Protein 6.0 - 8.3 g/dL 6.7 -  Total Bilirubin 0.2 - 1.2 mg/dL 0.3 -  Alkaline Phos 39 - 117 U/L 74 -  AST 0 - 37 U/L 12 -  ALT 0 - 35 U/L 16 -     RADIOGRAPHIC STUDIES: I have personally reviewed the radiological images as listed and agreed with the findings in the report. LONG TERM MONITOR (3-14 DAYS)  Result Date: 12/02/2020 HR 31 - 156 bpm, average 102 bpm. 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 No sustained arrhythmias. Patient triggered episodes correspond to sinus rhythm. Sheria Lang T. Lalla Brothers, MD, [facility], [facility] Cardiac Electrophysiology   ASSESSMENT & PLAN:  Brooke Bryant is a 35 y.o. African-American female with a history of    1. Leukocytosis, with predominant neutrophil -Based on Epic records, in the past 3.5 years (since 2018) her WBC has been slightly elevated with fluctuation. Her WBC  differential shows elevated ANC mostly and slightly elevated eosinophil percentage, but normal absolute eosinophil count.  -We discussed the etiology of leukocytosis.  I think her mild neutrophilia is likely related to allergy or chronic inflammation. -She denies recent or recurrent infections or fever. She quit smoking in 2016 and stopped recurrence skin abscess after having her son 3 years ago. She does have seasonal allergies or possibly inflammation.  -The other etiology of neutrophilia, especially primary pulmonary disease, such as chronic mild leukemia, lymphoma, myeloproliferative neoplasm, are unlikely, given her young age, lack of systemic symptoms, negative exam and normal hemoglobin and platelet count.  I do not recommend further work up. -I discussed this is overall reactive and benign process and we can continue monitoring.  -She can continue to  monitor her labs with her PCP. If she develops more concerning symptoms or lab work, she can see me for further evaluation. She is agreeable.    2. Obesity  -I discussed weight loss by change in diet and exercise. She will continue to f/u with PCP for management of this.    PLAN:  -Her mild leukocytosis is likely benign and reactive  -f/u with PCP with annual CBC with diff  -F/u with me as needed.   No orders of the defined types were placed in this encounter.   All questions were answered. The patient knows to call the clinic with any problems, questions or concerns. The total time spent in the appointment was 35 minutes.     Malachy Mood, MD 12/08/2020 6:26 PM  I, Delphina Cahill, am acting as scribe for Malachy Mood, MD.   I have reviewed the above documentation for accuracy and completeness, and I agree with the above.

## 2020-12-08 ENCOUNTER — Inpatient Hospital Stay: Payer: BC Managed Care – PPO | Attending: Hematology | Admitting: Hematology

## 2020-12-08 ENCOUNTER — Encounter: Payer: Self-pay | Admitting: Hematology

## 2020-12-08 ENCOUNTER — Other Ambulatory Visit: Payer: Self-pay

## 2020-12-08 DIAGNOSIS — E669 Obesity, unspecified: Secondary | ICD-10-CM | POA: Insufficient documentation

## 2020-12-08 DIAGNOSIS — D72828 Other elevated white blood cell count: Secondary | ICD-10-CM | POA: Diagnosis present

## 2020-12-08 DIAGNOSIS — Z803 Family history of malignant neoplasm of breast: Secondary | ICD-10-CM | POA: Diagnosis not present

## 2020-12-08 DIAGNOSIS — D72829 Elevated white blood cell count, unspecified: Secondary | ICD-10-CM | POA: Insufficient documentation

## 2020-12-08 DIAGNOSIS — Z87891 Personal history of nicotine dependence: Secondary | ICD-10-CM | POA: Diagnosis not present

## 2020-12-08 DIAGNOSIS — D72825 Bandemia: Secondary | ICD-10-CM

## 2020-12-14 ENCOUNTER — Other Ambulatory Visit: Payer: Self-pay | Admitting: Physician Assistant

## 2021-02-06 ENCOUNTER — Ambulatory Visit: Payer: BC Managed Care – PPO | Admitting: Cardiology

## 2021-03-05 ENCOUNTER — Encounter: Payer: Self-pay | Admitting: Obstetrics

## 2021-03-05 ENCOUNTER — Ambulatory Visit (INDEPENDENT_AMBULATORY_CARE_PROVIDER_SITE_OTHER): Payer: BC Managed Care – PPO | Admitting: Obstetrics

## 2021-03-05 ENCOUNTER — Other Ambulatory Visit: Payer: Self-pay

## 2021-03-05 VITALS — BP 135/83 | HR 88 | Wt 361.0 lb

## 2021-03-05 DIAGNOSIS — Z30432 Encounter for removal of intrauterine contraceptive device: Secondary | ICD-10-CM | POA: Diagnosis not present

## 2021-03-05 DIAGNOSIS — Z3169 Encounter for other general counseling and advice on procreation: Secondary | ICD-10-CM

## 2021-03-05 MED ORDER — PNV-DHA+DOCUSATE 27-1.25-300 MG PO CAPS: 1.0000 | ORAL_CAPSULE | Freq: Every day | ORAL | 4 refills | Status: DC

## 2021-03-05 NOTE — Progress Notes (Signed)
Pt is in office for IUD removal, hoping to conceive.

## 2021-03-05 NOTE — Progress Notes (Signed)
    GYNECOLOGY OFFICE PROCEDURE NOTE  Brooke Bryant is a 35 y.o. G1P1001 here for Liletta IUD removal. No GYN concerns.  Last pap smear was on 10-17-2019 and was normal.  IUD Removal  Patient identified, informed consent performed, consent signed.  Patient was in the dorsal lithotomy position, normal external genitalia was noted.  A speculum was placed in the patient's vagina, normal discharge was noted, no lesions. The cervix was visualized, no lesions, no abnormal discharge.  The strings of the IUD were grasped and pulled using ring forceps. The IUD was removed in its entirety.  Patient tolerated the procedure well.    Patient plans for pregnancy soon and she was told to avoid teratogens, take PNV and folic acid.  Routine preventative health maintenance measures emphasized.   Brock Bad, MD, FACOG Obstetrician & Gynecologist, [facility] for [facility], [facility]  03/05/21

## 2021-04-13 NOTE — Progress Notes (Deleted)
Cardiology Office Note   Date:  04/13/2021   ID:  Brooke Bryant, DOB 29-Sep-1985, MRN [id number]  PCP:  Jarold Motto, PA  Cardiologist:   None Referring:  ***  No chief complaint on file.     History of Present Illness: Brooke Bryant is a 35 y.o. female who is referred by *** for evaluation of palpitations.   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 ***    *** HR 31 - 156 bpm, average 102 bpm. 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 No sustained arrhythmias. Patient triggered episodes correspond to sinus rhythm.    Past Medical History:  Diagnosis Date   Allergy    Arthritis    Left knee    Past Surgical History:  Procedure Laterality Date   CESAREAN SECTION N/A 02/11/2018   Procedure: CESAREAN SECTION;  Surgeon: Lazaro Arms, MD;  Location: [facility] BIRTHING SUITES;  Service: Obstetrics;  Laterality: N/A;   GUM SURGERY     KNEE SURGERY Left      Current Outpatient Medications  Medication Sig Dispense Refill   albuterol (PROVENTIL HFA;VENTOLIN HFA) 108 (90 Base) MCG/ACT inhaler Inhale 1-2 puffs into the lungs every 6 (six) hours as needed for wheezing or shortness of breath.     meloxicam (MOBIC) 15 MG tablet TAKE 1 TABLET (15 MG TOTAL) BY MOUTH DAILY. 30 tablet 0   OVER THE COUNTER MEDICATION Take 2 each by mouth daily in the afternoon. Goli gummy dietary supplement     Prenat-FeFum-DSS-FA-DHA w/o A (PNV-DHA+DOCUSATE) 27-1.25-300 MG CAPS Take 1 capsule by mouth daily before breakfast. 90 capsule 4   No current facility-administered medications for this visit.    Allergies:   Patient has no known allergies.    Social History:  The patient  reports that she quit smoking about 6 years ago. She started smoking about 19 years ago. She has a 5.00 pack-year smoking history. She has never used smokeless tobacco. She reports current alcohol use of about 3.0 standard drinks per week.  She reports that she does not use drugs.   Family History:  The patient's ***family history includes Arthritis in her maternal grandfather; Cancer in her paternal aunt; Depression in her mother; Heart disease in her father; Hypertension in her father; Varicose Veins in her mother.    ROS:  Please see the history of present illness.   Otherwise, review of systems are positive for {NONE DEFAULTED:18576}.   All other systems are reviewed and negative.    PHYSICAL EXAM: VS:  There were no vitals taken for this visit. , BMI There is no height or weight on file to calculate BMI. GENERAL:  Well appearing HEENT:  Pupils equal round and reactive, fundi not visualized, oral mucosa unremarkable NECK:  No jugular venous distention, waveform within normal limits, carotid upstroke brisk and symmetric, no bruits, no thyromegaly LYMPHATICS:  No cervical, inguinal adenopathy LUNGS:  Clear to auscultation bilaterally BACK:  No CVA tenderness CHEST:  Unremarkable HEART:  PMI not displaced or sustained,S1 and S2 within normal limits, no S3, no S4, no clicks, no rubs, *** murmurs ABD:  Flat, positive bowel sounds normal in frequency in pitch, no bruits, no rebound, no guarding, no midline pulsatile mass, no hepatomegaly, no splenomegaly EXT:  2 plus pulses throughout, no edema, no cyanosis no clubbing SKIN:  No rashes no nodules NEURO:  Cranial nerves II through XII grossly intact, motor grossly intact  throughout [facility]:  Cognitively intact, oriented to person place and time    EKG:  EKG {ACTION; IS/IS IZT:24580998} ordered today. The ekg ordered today demonstrates ***   Recent Labs: 11/10/2020: ALT 16; BUN 13; Creatinine, Ser 0.73; Potassium 3.9; Sodium 139; TSH 2.00 11/26/2020: Hemoglobin 12.9; Platelets 283.0    Lipid Panel No results found for: CHOL, TRIG, HDL, CHOLHDL, VLDL, LDLCALC, LDLDIRECT    Wt Readings from Last 3 Encounters:  03/05/21 (!) 361 lb (163.7 kg)  12/08/20 (!) 350 lb 14.4 oz  (159.2 kg)  11/10/20 (!) 345 lb 4 oz (156.6 kg)      Other studies Reviewed: Additional studies/ records that were reviewed today include: ***. Review of the above records demonstrates:  Please see elsewhere in the note.  ***   ASSESSMENT AND PLAN:  PALPITATIONS:  ***   Current medicines are reviewed at length with the patient today.  The patient {ACTIONS; HAS/DOES NOT HAVE:19233} concerns regarding medicines.  The following changes have been made:  {PLAN; NO CHANGE:13088:s}  Labs/ tests ordered today include: *** No orders of the defined types were placed in this encounter.    Disposition:   FU with ***    Signed, Rollene Rotunda, MD  04/13/2021 8:45 PM    [facility]

## 2021-04-14 ENCOUNTER — Ambulatory Visit: Payer: BC Managed Care – PPO | Admitting: Cardiology

## 2021-06-15 ENCOUNTER — Ambulatory Visit (INDEPENDENT_AMBULATORY_CARE_PROVIDER_SITE_OTHER): Payer: Self-pay | Admitting: Physician Assistant

## 2021-06-15 ENCOUNTER — Other Ambulatory Visit: Payer: Self-pay

## 2021-06-15 ENCOUNTER — Encounter: Payer: Self-pay | Admitting: Physician Assistant

## 2021-06-15 VITALS — BP 130/80 | HR 88 | Temp 98.0°F | Ht 65.0 in | Wt 355.4 lb

## 2021-06-15 DIAGNOSIS — M25561 Pain in right knee: Secondary | ICD-10-CM

## 2021-06-15 NOTE — Patient Instructions (Signed)
It was great to see you!  Oral ibuprofen please take 600 mg every 8 hours for your pain  Ice your knee and elevate  Send me a mychart message if any further concerns.  Take care,  Jarold Motto PA-C

## 2021-06-15 NOTE — Progress Notes (Signed)
Brooke Bryant is a 35 y.o. female here for knee pain.  History of Present Illness:   Chief Complaint  Patient presents with   Knee Pain    Pt c/o right knee pain yesterday having trouble walking on it. She was having pain in her left knee off and on but bearable. Has been doing icy hot, ace wrap, Tylenol x-strength and elevation and ice packs.    HPI  Bilateral Knee Pain Brooke Bryant states she has been experiencing constant right knee pain for about two weeks, recently becoming unbearable as of yesterday. Initially when the pain began she believed it to be caused by jumping back into working out after stopping for about a week, but states she has done that before with no problems. She does admit to feeling the pain radiate to the back of her leg usually when at work since she is standing for five hours straight. Brooke Bryant reports the pain has gotten so bad that she is unable to bend her leg which has caused her to begin limping. Pt has tried applying ice, elevating her leg, topical cream, and taking tylenol which has provided minor relief.   Pt does have a hx of arthritis in her left knee as well as knee surgery about twenty years ago. She does say her left knee has also started hurting as of 06/02/21, but is much more bearable than her right knee pain. Denies recent injury.    Past Medical History:  Diagnosis Date   Allergy    Arthritis    Left knee     Social History   Tobacco Use   Smoking status: Former    Packs/day: 1.00    Years: 5.00    Pack years: 5.00    Types: Cigarettes    Start date: 11/10/2001    Quit date: 08/02/2014    Years since quitting: 6.8   Smokeless tobacco: Never  Vaping Use   Vaping Use: Never used  Substance Use Topics   Alcohol use: Yes    Alcohol/week: 3.0 standard drinks    Types: 3 Shots of liquor per week   Drug use: No    Past Surgical History:  Procedure Laterality Date   CESAREAN SECTION N/A 02/11/2018   Procedure: CESAREAN SECTION;   Surgeon: Lazaro Arms, MD;  Location: [facility] BIRTHING SUIT[facility];  Service: Obstetrics;  Laterality: N/A;   GUM SURGERY     KNEE SURGERY Left     Family History  Problem Relation Age of Onset   Depression Mother    Varicose Veins Mother    Heart disease Father    Hypertension Father    Arthritis Maternal Grandfather    Cancer Paternal Aunt        breast cancer     No Known Allergies  Current Medications:   Current Outpatient Medications:    meloxicam (MOBIC) 15 MG tablet, TAKE 1 TABLET (15 MG TOTAL) BY MOUTH DAILY., Disp: 30 tablet, Rfl: 0   OVER THE COUNTER MEDICATION, Take 2 each by mouth daily in the afternoon. Goli gummy dietary supplement, Disp: , Rfl:    Review of Systems:   ROS Negative unless otherwise specified per HPI. Vitals:   Vitals:   06/15/21 1528  BP: 130/80  Pulse: 88  Temp: 98 F (36.7 C)  TempSrc: Temporal  SpO2: 94%  Weight: (!) 355 lb 6.1 oz (161.2 kg)  Height: 5\' 5"  (1.651 m)     Body mass index is 59.14 kg/m.  Physical Exam:  Physical Exam Constitutional:      Appearance: Normal appearance. She is well-developed.  HENT:     Head: Normocephalic and atraumatic.  Eyes:     General: Lids are normal.     Extraocular Movements: Extraocular movements intact.     Conjunctiva/sclera: Conjunctivae normal.  Pulmonary:     Effort: Pulmonary effort is normal.  Musculoskeletal:     Cervical back: Normal range of motion and neck supple.     Right knee: Swelling present. Decreased range of motion.     Comments: Knee exam: Mild swelling to lateral right knee No decreased ROM appreciated Generalized TTP    Skin:    General: Skin is warm and dry.  Neurological:     Mental Status: She is alert and oriented to person, place, and time.  Psychiatric:        Attention and Perception: Attention and perception normal.        Mood and Affect: Mood normal.        Behavior: Behavior normal.        Thought Content: Thought content normal.         Judgment: Judgment normal.    Assessment and Plan:   Acute pain of right knee Suspect possible strain Discussed options for treatment Due to worsening symptoms, will have her stay off leg for a few days -- note for work provided today Ice and ibuprofen recommended -- 600 mg every 8 hours as needed If no improvement in two weeks or any worsening,  I have asked patient to follow-up with [place] via mychart (she is currently self pay) and we will advise further   I,Havlyn C Ratchford,acting as a scribe for [employer], PA.,have documented all relevant documentation on the behalf of Brooke Motto, PA,as directed by  Brooke Motto, PA while in the presence of Brooke Bryant, [place].   I, Brooke Bryant, [place], have reviewed all documentation for this visit. The documentation on 06/15/21 for the exam, diagnosis, procedures, and orders are all accurate and complete.   Brooke Motto, PA-C

## 2021-10-22 ENCOUNTER — Ambulatory Visit (INDEPENDENT_AMBULATORY_CARE_PROVIDER_SITE_OTHER): Payer: No Typology Code available for payment source | Admitting: Physician Assistant

## 2021-10-22 ENCOUNTER — Encounter: Payer: Self-pay | Admitting: Physician Assistant

## 2021-10-22 VITALS — BP 122/83 | HR 93 | Temp 98.5°F | Ht 65.0 in | Wt 341.0 lb

## 2021-10-22 DIAGNOSIS — A084 Viral intestinal infection, unspecified: Secondary | ICD-10-CM | POA: Diagnosis not present

## 2021-10-22 MED ORDER — DICYCLOMINE HCL 10 MG PO CAPS: 10.0000 mg | ORAL_CAPSULE | Freq: Three times a day (TID) | ORAL | 0 refills | Status: DC

## 2021-10-22 MED ORDER — ONDANSETRON 8 MG PO TBDP: 8.0000 mg | ORAL_TABLET | Freq: Three times a day (TID) | ORAL | 0 refills | Status: DC | PRN

## 2021-10-22 NOTE — Progress Notes (Signed)
? ?Subjective:  ? ? Patient ID: Brooke Bryant, female    DOB: [birth year], 36 y.o.   MRN: [id number] ? ?Chief Complaint  ?Patient presents with  ? Abdominal Pain  ?  Vomiting,chills started yesterday  ? Diarrhea  ? ? ?Abdominal Pain ?Associated symptoms include diarrhea.  ?Diarrhea  ?Associated symptoms include abdominal pain.  ?Patient is in today for GI symptoms. ?Went to work yesterday. ?Around 8 am felt like "knots" in her stomach and had watery diarrhea.  ?Continues to have watery diarrhea, nausea, vomiting, diffuse abdominal pain. ?She has been able to drink Ginger Ale, Gatorade. ?Feeling fatigued, tired today.  ?Mother had same illness one week ago - lasted about 3 days.  ? ?Still with nausea currently. Some chills & cough congestion. No blood in stool. No passing out. No SOB, no HA, no CP.  ? ?Past Medical History:  ?Diagnosis Date  ? Allergy   ? Arthritis   ? Left knee  ? ? ?Past Surgical History:  ?Procedure Laterality Date  ? CESAREAN SECTION N/A 02/11/2018  ? Procedure: CESAREAN SECTION;  Surgeon: Lazaro Arms, MD;  Location: [facility] BIRTHING SUITES;  Service: Obstetrics;  Laterality: N/A;  ? GUM SURGERY    ? KNEE SURGERY Left   ? ? ?Family History  ?Problem Relation Age of Onset  ? Depression Mother   ? Varicose Veins Mother   ? Heart disease Father   ? Hypertension Father   ? Arthritis Maternal Grandfather   ? Cancer Paternal Aunt   ?     breast cancer   ? ? ?Social History  ? ?Tobacco Use  ? Smoking status: Former  ?  Packs/day: 1.00  ?  Years: 5.00  ?  Pack years: 5.00  ?  Types: Cigarettes  ?  Start date: 11/10/2001  ?  Quit date: 08/02/2014  ?  Years since quitting: 7.2  ? Smokeless tobacco: Never  ?Vaping Use  ? Vaping Use: Never used  ?Substance Use Topics  ? Alcohol use: Yes  ?  Alcohol/week: 3.0 standard drinks  ?  Types: 3 Shots of liquor per week  ? Drug use: No  ?  ? ?No Known Allergies ? ?Review of Systems  ?Gastrointestinal:  Positive for abdominal pain and diarrhea.  ?NEGATIVE UNLESS  OTHERWISE INDICATED IN HPI ? ? ?   ?Objective:  ?  ? ?BP 122/83   Pulse 93   Temp 98.5 ?F (36.9 ?C)   Ht 5\' 5"  (1.651 m)   Wt (!) 341 lb (154.7 kg)   LMP 10/11/2021   SpO2 97%   BMI 56.75 kg/m?  ? ?Wt Readings from Last 3 Encounters:  ?10/22/21 (!) 341 lb (154.7 kg)  ?06/15/21 (!) 355 lb 6.1 oz (161.2 kg)  ?03/05/21 (!) 361 lb (163.7 kg)  ? ? ?BP Readings from Last 3 Encounters:  ?10/22/21 122/83  ?06/15/21 130/80  ?03/05/21 135/83  ?  ? ?Physical Exam ?Vitals and nursing note reviewed.  ?Constitutional:   ?   General: She is not in acute distress. ?   Appearance: Normal appearance. She is obese. She is not ill-appearing.  ?HENT:  ?   Head: Normocephalic and atraumatic.  ?Cardiovascular:  ?   Rate and Rhythm: Normal rate and regular rhythm.  ?   Pulses: Normal pulses.  ?   Heart sounds: Normal heart sounds.  ?Pulmonary:  ?   Effort: Pulmonary effort is normal.  ?   Breath sounds: Normal breath sounds.  ?Abdominal:  ?  General: Abdomen is flat. Bowel sounds are increased.  ?   Palpations: Abdomen is soft.  ?   Tenderness: There is generalized abdominal tenderness (mild). There is no right CVA tenderness or left CVA tenderness.  ?Skin: ?   General: Skin is warm and dry.  ?Neurological:  ?   General: No focal deficit present.  ?   Mental Status: She is alert.  ?Psychiatric:     ?   Mood and Affect: Mood normal.  ? ? ?   ?Assessment & Plan:  ? ?Problem List Items Addressed This Visit   ?None ?Visit Diagnoses   ? ? Viral gastroenteritis    -  Primary  ? ?  ? ? ? ?Meds ordered this encounter  ?Medications  ? ondansetron (ZOFRAN-ODT) 8 MG disintegrating tablet  ?  Sig: Take 1 tablet (8 mg total) by mouth every 8 (eight) hours as needed for nausea or vomiting.  ?  Dispense:  20 tablet  ?  Refill:  0  ? dicyclomine (BENTYL) 10 MG capsule  ?  Sig: Take 1 capsule (10 mg total) by mouth 4 (four) times daily -  before meals and at bedtime for 5 days.  ?  Dispense:  20 capsule  ?  Refill:  0  ? ? ?1. Viral  gastroenteritis ?Patient not in any acute distress.  No red flag symptoms.  Reassured her likely viral GE.  Take Zofran and Bentyl as directed.  Be sure to push fluids small sips at a time.  Then resume BRAT diet after 24 hours. Take COVID test at home and let me know results via MyChart. ER precautions discussed. ? ? ?This note was prepared with assistance of Conservation officer, historic buildings. Occasional wrong-word or sound-a-like substitutions may have occurred due to the inherent limitations of voice recognition software. ? ? ? ?Osker Ayoub M Kupono Marling, PA-C ?

## 2022-02-01 ENCOUNTER — Ambulatory Visit (INDEPENDENT_AMBULATORY_CARE_PROVIDER_SITE_OTHER): Payer: No Typology Code available for payment source

## 2022-02-01 DIAGNOSIS — Z3480 Encounter for supervision of other normal pregnancy, unspecified trimester: Secondary | ICD-10-CM

## 2022-02-01 NOTE — Progress Notes (Cosign Needed)
New OB Intake  I connected with  Brooke Bryant on 02/01/22 at  2:10 PM EDT by telephone Video Visit and verified that I am speaking with the correct person using two identifiers. Nurse is located at [facility] and pt is located at home.  I discussed the limitations, risks, security and privacy concerns of performing an evaluation and management service by telephone and the availability of in person appointments. I also discussed with the patient that there may be a patient responsible charge related to this service. The patient expressed understanding and agreed to proceed.  I explained I am completing New OB Intake today. We discussed her EDD of 08/18/22 that is based on LMP of 11/11/21. Pt is G2/P1001. I reviewed her allergies, medications, Medical/Surgical/OB history, and appropriate screenings. I informed her of [facility] services. Based on history, this is a/an  pregnancy uncomplicated .   Patient Active Problem List   Diagnosis Date Noted   Leukocytosis 12/08/2020    Concerns addressed today  Delivery Plans:  Plans to deliver at [facility].   MyChart/Babyscripts MyChart access verified. I explained pt will have some visits in office and some virtually. Babyscripts instructions given and order placed. Patient verifies receipt of registration text/e-mail. Account successfully created and app downloaded.  Blood Pressure Cuff  Patient has private insurance; instructed to purchase blood pressure cuff and bring to first prenatal appt. Patient has access to BP cuff at home. Explained after first prenatal appt pt will check weekly and document in Babyscripts.  Weight scale: Patient does not  have weight scale. Weight scale ordered for patient to pick up from [employer].   Anatomy US Explained first scheduled [place] will be around 19 weeks. Dating and viability scan performed at [facility]. Patient to bring a copy at next visit.  Labs Discussed Avelina Laine genetic screening with  patient. Would like both Panorama and Horizon drawn at new OB visit.Also if interested in genetic testing, tell patient she will need AFP 15-21 weeks to complete genetic testing .Routine prenatal labs needed.  Covid Vaccine Patient has covid vaccine.   Is patient a CenteringPregnancy candidate?  Not a Candidate Declined due to Other   Not a candidate due to  Centering Patient" indicated on sticky note   Is patient interested in [place]?  No  "Interested in [employer] - Schedule next visit with CNM" on sticky note  Informed patient of [facility] website  and placed link in her AVS.   Social Determinants of Health Food Insecurity: Patient denies food insecurity. WIC Referral: Patient is interested in referral to [facility]  Transportation: Patient denies transportation needs. Childcare: Discussed no children allowed at ultrasound appointments. Offered childcare services; patient declines childcare services at this time.  Send link to Pregnancy Navigators   Placed OB Box on problem list and updated  First visit review I reviewed new OB appt with pt. I explained she will have a pelvic exam, ob bloodwork with genetic screening, and PAP smear. Explained pt will be seen by Mariel Aloe at first visit; encounter routed to appropriate provider. Explained that patient will be seen by pregnancy navigator following visit with provider. [facility] information placed in AVS.   Hamilton Capri, RN 02/01/2022  2:24 PM

## 2022-02-01 NOTE — Progress Notes (Signed)
Patient was assessed and managed by nursing staff during this encounter. I have reviewed the chart and agree with the documentation and plan. I have also made any necessary editorial changes.  Warden Fillers, MD 02/01/2022 6:13 PM

## 2022-02-04 ENCOUNTER — Other Ambulatory Visit (HOSPITAL_COMMUNITY)
Admission: RE | Admit: 2022-02-04 | Discharge: 2022-02-04 | Disposition: A | Discharge status: Home / Self Care | Payer: No Typology Code available for payment source | Source: Ambulatory Visit | Attending: Obstetrics and Gynecology | Admitting: Obstetrics and Gynecology

## 2022-02-04 ENCOUNTER — Ambulatory Visit (INDEPENDENT_AMBULATORY_CARE_PROVIDER_SITE_OTHER): Payer: No Typology Code available for payment source | Admitting: Obstetrics and Gynecology

## 2022-02-04 VITALS — BP 119/79 | HR 84 | Wt 345.0 lb

## 2022-02-04 DIAGNOSIS — O09521 Supervision of elderly multigravida, first trimester: Secondary | ICD-10-CM

## 2022-02-04 DIAGNOSIS — Z3481 Encounter for supervision of other normal pregnancy, first trimester: Secondary | ICD-10-CM

## 2022-02-04 DIAGNOSIS — O99211 Obesity complicating pregnancy, first trimester: Secondary | ICD-10-CM

## 2022-02-04 DIAGNOSIS — Z3480 Encounter for supervision of other normal pregnancy, unspecified trimester: Secondary | ICD-10-CM | POA: Insufficient documentation

## 2022-02-04 DIAGNOSIS — Z3A12 12 weeks gestation of pregnancy: Secondary | ICD-10-CM

## 2022-02-04 DIAGNOSIS — O99210 Obesity complicating pregnancy, unspecified trimester: Secondary | ICD-10-CM | POA: Insufficient documentation

## 2022-02-04 DIAGNOSIS — O09529 Supervision of elderly multigravida, unspecified trimester: Secondary | ICD-10-CM | POA: Insufficient documentation

## 2022-02-04 DIAGNOSIS — Z6841 Body Mass Index (BMI) 40.0 and over, adult: Secondary | ICD-10-CM

## 2022-02-04 DIAGNOSIS — Z98891 History of uterine scar from previous surgery: Secondary | ICD-10-CM | POA: Insufficient documentation

## 2022-02-04 DIAGNOSIS — Z131 Encounter for screening for diabetes mellitus: Secondary | ICD-10-CM | POA: Diagnosis not present

## 2022-02-04 MED ORDER — ASPIRIN 81 MG PO CHEW: 81.0000 mg | CHEWABLE_TABLET | Freq: Every day | ORAL | 7 refills | Status: DC

## 2022-02-04 NOTE — Progress Notes (Signed)
INITIAL PRENATAL VISIT NOTE  Subjective:  Brooke Bryant is a 36 y.o. G2P1001 at [redacted]w[redacted]d by LMP being seen today for her initial prenatal visit. She has an obstetric history significant for cesarean section x 1. She has a medical history significant for morbid obesity with BMI of 57.  Patient reports no complaints.  Contractions: Not present. Vag. Bleeding: None.   . Denies leaking of fluid.    Past Medical History:  Diagnosis Date   Allergy    Arthritis    Left knee    Past Surgical History:  Procedure Laterality Date   CESAREAN SECTION N/A 02/11/2018   Procedure: CESAREAN SECTION;  Surgeon: Lazaro Arms, MD;  Location: [facility]IRTHING SUITES;  Service: Obstetrics;  Laterality: N/A;   GUM SURGERY     KNEE SURGERY Left     OB History  Gravida Para Term Preterm AB Living  2 1 1  0 0 1  SAB IAB Ectopic Multiple Live Births  0 0 0 0 1    # Outcome Date GA Lbr Len/2nd Weight Sex Delivery Anes PTL Lv  2 Current           1 Term 02/11/18 [redacted]w[redacted]d  6 lb 10.9 oz (3.03 kg) M CS-LTranv EPI  LIV    Social History   Socioeconomic History   Marital status: Married    Spouse name: Not on file   Number of children: 1   Years of education: Not on file   Highest education level: Not on file  Occupational History   Not on file  Tobacco Use   Smoking status: Former    Packs/day: 1.00    Years: 5.00    Total pack years: 5.00    Types: Cigarettes    Start date: 11/10/2001    Quit date: 08/02/2014    Years since quitting: 7.5   Smokeless tobacco: Never  Vaping Use   Vaping Use: Never used  Substance and Sexual Activity   Alcohol use: Not Currently    Alcohol/week: 3.0 standard drinks of alcohol    Types: 3 Shots of liquor per week    Comment: Not since confirmed pregnancy   Drug use: No   Sexual activity: Yes    Partners: Male    Birth control/protection: None  Other Topics Concern   Not on file  Social History Narrative   Married 2018   Son in 2019   Works for [employer]  part time   Social Determinants of [employer] Strain: Not on file  Food Insecurity: Not on file  Transportation Needs: Not on file  Physical Activity: Not on file  Stress: Not on file  Social Connections: Not on file    Family History  Problem Relation Age of Onset   Depression Mother    Varicose Veins Mother    Kidney disease Mother    Heart disease Father    Hypertension Father    Arthritis Maternal Grandfather    Cancer Paternal Aunt        breast cancer      Current Outpatient Medications:    aspirin 81 MG chewable tablet, Chew 1 tablet (81 mg total) by mouth daily. Start at 14-15 weeks, Disp: 30 tablet, Rfl: 7   meloxicam (MOBIC) 15 MG tablet, TAKE 1 TABLET (15 MG TOTAL) BY MOUTH DAILY., Disp: 30 tablet, Rfl: 0   Prenatal MV & Min w/FA-DHA (PRENATAL GUMMIES PO), Take 2 tablets by mouth daily., Disp: , Rfl:  dicyclomine (BENTYL) 10 MG capsule, Take 1 capsule (10 mg total) by mouth 4 (four) times daily -  before meals and at bedtime for 5 days., Disp: 20 capsule, Rfl: 0   ondansetron (ZOFRAN-ODT) 8 MG disintegrating tablet, Take 1 tablet (8 mg total) by mouth every 8 (eight) hours as needed for nausea or vomiting. (Patient not taking: Reported on 02/01/2022), Disp: 20 tablet, Rfl: 0   OVER THE COUNTER MEDICATION, Take 2 each by mouth daily in the afternoon. [place]oli gummy dietary supplement (Patient not taking: Reported on 02/01/2022), Disp: , Rfl:   No Known Allergies  Review of Systems: Negative except for what is mentioned in HPI.  Objective:   Vitals:   02/04/22 1506  BP: 119/79  Pulse: 84  Weight: (!) 345 lb (156.5 kg)    Fetal Status:           Physical Exam: BP 119/79   Pulse 84   Wt (!) 345 lb (156.5 kg)   LMP 11/11/2021   BMI 57.41 kg/m  CONSTITUTIONAL: Well-developed,obese female in no acute distress.  NEUROLOGIC: Alert and oriented to person, place, and time. Normal reflexes, muscle tone coordination. No cranial nerve deficit  noted. PSYCHIATRIC: Normal mood and affect. Normal behavior. Normal judgment and thought content. SKIN: Skin is warm and dry. No rash noted. Not diaphoretic. No erythema. No pallor. HENT:  Normocephalic, atraumatic, External right and left ear normal. Oropharynx is clear and moist EYES: Conjunctivae and EOM are normal.  NECK: Normal range of motion, supple, no masses CARDIOVASCULAR: Normal heart rate noted, regular rhythm RESPIRATORY: Effort and breath sounds normal, no problems with respiration noted BREASTS: deferred ABDOMEN: Soft, nontender, nondistended, gravid., large pannus detected GU: normal appearing external female genitalia, nulliparous normal appearing cervix, scant white discharge in vagina, no lesions noted Bimanual: 14 weeks sized uterus, no adnexal tenderness or palpable lesions noted, exam suboptimal due to extreme body habitus MUSCULOSKELETAL: Normal range of motion. EXT:  No edema and no tenderness. 2+ distal pulses. Live IUP noted on bedside ultrasound  Assessment and Plan:  Pregnancy: G2P1001 at [redacted]w[redacted]d by LMP  1. [redacted] weeks gestation of pregnancy   2. Supervision of other normal pregnancy, antepartum Continue routine prenatal care Baseline preeclampsia labs ordered Discussed minimal weight gain this pregnancy Information given regarding VBAC, pt may decide by 28 weeks  - Panorama Prenatal Test Full Panel - [facility] CUSTOM - Culture, OB Urine - CBC/D/Plt+RPR+Rh+ABO+RubIgG... - Hepatitis C Antibody - Cervicovaginal ancillary only( [place]) - HgB A1c - aspirin 81 MG chewable tablet; Chew 1 tablet (81 mg total) by mouth daily. Start at 14-15 weeks  Dispense: 30 tablet; Refill: 7  3. BMI 50.0-59.9, adult (HCC)   4. Obesity affecting pregnancy in first trimester  - HgB A1c - Comprehensive metabolic panel - Protein / creatinine ratio, urine - aspirin 81 MG chewable tablet; Chew 1 tablet (81 mg total) by mouth daily. Start at 14-15 weeks  Dispense: 30 tablet;  Refill: 7  5. Multigravida of advanced maternal age in first trimester   6. History of cesarean delivery    Preterm labor symptoms and general obstetric precautions including but not limited to vaginal bleeding, contractions, leaking of fluid and fetal movement were reviewed in detail with the patient.  Please refer to After Visit Summary for other counseling recommendations.   Return in about 4 weeks (around 03/04/2022) for [facility], in person.  Warden Fillers 02/04/2022 3:49 PM

## 2022-02-04 NOTE — Progress Notes (Signed)
NOB in office, intake completed on 02/01/22. U/S done at [facility] at 7 weeks

## 2022-02-05 LAB — CBC/D/PLT+RPR+RH+ABO+RUBIGG...
Antibody Screen: NEGATIVE
Basophils Absolute: 0.1 10*3/uL (ref 0.0–0.2)
Basos: 0 %
EOS (ABSOLUTE): 0.4 10*3/uL (ref 0.0–0.4)
Eos: 3 %
HCV Ab: NONREACTIVE
HIV Screen 4th Generation wRfx: NONREACTIVE
Hematocrit: 34.9 % (ref 34.0–46.6)
Hemoglobin: 12.1 g/dL (ref 11.1–15.9)
Hepatitis B Surface Ag: NEGATIVE
Immature Grans (Abs): 0 10*3/uL (ref 0.0–0.1)
Immature Granulocytes: 0 %
Lymphocytes Absolute: 2.6 10*3/uL (ref 0.7–3.1)
Lymphs: 19 %
MCH: 30 pg (ref 26.6–33.0)
MCHC: 34.7 g/dL (ref 31.5–35.7)
MCV: 86 fL (ref 79–97)
Monocytes Absolute: 1.1 10*3/uL — ABNORMAL HIGH (ref 0.1–0.9)
Monocytes: 8 %
Neutrophils Absolute: 9.4 10*3/uL — ABNORMAL HIGH (ref 1.4–7.0)
Neutrophils: 70 %
Platelets: 305 10*3/uL (ref 150–450)
RBC: 4.04 x10E6/uL (ref 3.77–5.28)
RDW: 12 % (ref 11.7–15.4)
RPR Ser Ql: NONREACTIVE
Rh Factor: POSITIVE
Rubella Antibodies, IGG: 5.68 index (ref >0.99)
WBC: 13.5 10*3/uL — ABNORMAL HIGH (ref 3.4–10.8)

## 2022-02-05 LAB — COMPREHENSIVE METABOLIC PANEL
ALT: 30 IU/L (ref 0–32)
AST: 23 IU/L (ref 0–40)
Albumin/Globulin Ratio: 1.5 (ref 1.2–2.2)
Albumin: 4 g/dL (ref 3.8–4.8)
Alkaline Phosphatase: 68 IU/L (ref 44–121)
BUN/Creatinine Ratio: 21 (ref 9–23)
BUN: 13 mg/dL (ref 6–20)
Bilirubin Total: <0.2 mg/dL (ref 0.0–1.2)
CO2: 22 mmol/L (ref 20–29)
Calcium: 9.4 mg/dL (ref 8.7–10.2)
Chloride: 102 mmol/L (ref 96–106)
Creatinine, Ser: 0.62 mg/dL (ref 0.57–1.00)
Globulin, Total: 2.6 g/dL (ref 1.5–4.5)
Glucose: 70 mg/dL (ref 70–99)
Potassium: 4 mmol/L (ref 3.5–5.2)
Sodium: 138 mmol/L (ref 134–144)
Total Protein: 6.6 g/dL (ref 6.0–8.5)
eGFR: 119 mL/min/{1.73_m2} (ref >59)

## 2022-02-05 LAB — PROTEIN / CREATININE RATIO, URINE
Creatinine, Urine: 162.8 mg/dL
Protein, Ur: 13.7 mg/dL
Protein/Creat Ratio: 84 mg/g creat (ref 0–200)

## 2022-02-05 LAB — CERVICOVAGINAL ANCILLARY ONLY
Bacterial Vaginitis (gardnerella): NEGATIVE
Candida Glabrata: NEGATIVE
Candida Vaginitis: NEGATIVE
Chlamydia: NEGATIVE
Comment: NEGATIVE
Comment: NEGATIVE
Comment: NEGATIVE
Comment: NEGATIVE
Comment: NEGATIVE
Comment: NORMAL
Neisseria Gonorrhea: NEGATIVE
Trichomonas: NEGATIVE

## 2022-02-05 LAB — HEPATITIS C ANTIBODY: Hep C Virus Ab: NONREACTIVE

## 2022-02-05 LAB — HEMOGLOBIN A1C
Est. average glucose Bld gHb Est-mCnc: 117 mg/dL
Hgb A1c MFr Bld: 5.7 % — ABNORMAL HIGH (ref 4.8–5.6)

## 2022-02-05 LAB — HCV INTERPRETATION

## 2022-02-06 LAB — URINE CULTURE, OB REFLEX

## 2022-02-06 LAB — CULTURE, OB URINE

## 2022-02-13 LAB — PANORAMA PRENATAL TEST FULL PANEL:PANORAMA TEST PLUS 5 ADDITIONAL MICRODELETIONS: FETAL FRACTION: 3.2

## 2022-02-14 LAB — HORIZON CUSTOM: REPORT SUMMARY: NEGATIVE

## 2022-03-04 ENCOUNTER — Ambulatory Visit (INDEPENDENT_AMBULATORY_CARE_PROVIDER_SITE_OTHER): Payer: Medicaid Other | Admitting: Obstetrics & Gynecology

## 2022-03-04 VITALS — BP 126/81 | HR 84 | Wt 346.0 lb

## 2022-03-04 DIAGNOSIS — O09529 Supervision of elderly multigravida, unspecified trimester: Secondary | ICD-10-CM

## 2022-03-04 DIAGNOSIS — Z3480 Encounter for supervision of other normal pregnancy, unspecified trimester: Secondary | ICD-10-CM

## 2022-03-04 DIAGNOSIS — Z3A16 16 weeks gestation of pregnancy: Secondary | ICD-10-CM

## 2022-03-04 DIAGNOSIS — O99210 Obesity complicating pregnancy, unspecified trimester: Secondary | ICD-10-CM

## 2022-03-04 DIAGNOSIS — Z98891 History of uterine scar from previous surgery: Secondary | ICD-10-CM

## 2022-03-04 NOTE — Progress Notes (Signed)
  Subjective:    Brooke Bryant is a G2P1001 [redacted]w[redacted]d being seen today for her first obstetrical visit.  Her obstetrical history is significant for obesity. Patient does intend to breast feed. Pregnancy history fully reviewed.  Patient reports no complaints.  Vitals:   03/04/22 1337  BP: 126/81  Pulse: 84  Weight: (!) 346 lb (156.9 kg)    HISTORY: OB History  Gravida Para Term Preterm AB Living  2 1 1  0 0 1  SAB IAB Ectopic Multiple Live Births  0 0 0 0 1    # Outcome Date GA Lbr Len/2nd Weight Sex Delivery Anes PTL Lv  2 Current           1 Term 02/11/18 [redacted]w[redacted]d  6 lb 10.9 oz (3.03 kg) M CS-LTranv EPI  LIV   Past Medical History:  Diagnosis Date  . Allergy   . Arthritis    Left knee   Past Surgical History:  Procedure Laterality Date  . CESAREAN SECTION N/A 02/11/2018   Procedure: CESAREAN SECTION;  Surgeon: 02/13/2018, MD;  Location: [facility] BIRTHING SUITES;  Service: Obstetrics;  Laterality: N/A;  . GUM SURGERY    . KNEE SURGERY Left    Family History  Problem Relation Age of Onset  . Depression Mother   . Varicose Veins Mother   . Kidney disease Mother   . Heart disease Father   . Hypertension Father   . Arthritis Maternal Grandfather   . Cancer Paternal Aunt        breast cancer      Exam    Uterus:  Fundal Height: 16 cm  Pelvic Exam:    Perineum: No Hemorrhoids   Vulva: normal   Vagina:  normal mucosa   pH:    Cervix: no lesions   Adnexa: not evaluated   Bony Pelvis: average  System: Breast:  normal appearance, no masses or tenderness   Skin: normal coloration and turgor, no rashes    Neurologic: oriented, normal mood   Extremities: normal strength, tone, and muscle mass   HEENT PERRLA   Mouth/Teeth mucous membranes moist, pharynx normal without lesions   Neck supple   Cardiovascular: regular rate and rhythm   Respiratory:  appears well, vitals normal, no respiratory distress, acyanotic, normal RR, neck free of mass or lymphadenopathy, chest  clear, no wheezing, crepitations, rhonchi, normal symmetric air entry   Abdomen: soft, non-tender; bowel sounds normal; no masses,  no organomegaly   Urinary: urethral meatus normal      Assessment:    Pregnancy: G2P1001 Patient Active Problem List   Diagnosis Date Noted  . BMI 50.0-59.9, adult (HCC) 02/04/2022  . Obesity complicating pregnancy 02/04/2022  . Advanced maternal age in multigravida 02/04/2022  . History of cesarean delivery 02/04/2022  . Supervision of other normal pregnancy, antepartum 02/01/2022  . Leukocytosis 12/08/2020        Plan:     Initial labs drawn. Prenatal vitamins. Problem list reviewed and updated. Genetic Screening discussed Quad Screen: requested.  Ultrasound discussed; fetal survey: ordered.  Follow up in 4 weeks. 50% of 30 min visit spent on counseling and coordination of care.    02/07/2021 03/04/2022

## 2022-03-10 LAB — AFP, SERUM, OPEN SPINA BIFIDA
AFP MoM: 1.52
AFP Value: 36.9 ng/mL
Gest. Age on Collection Date: 16.1 weeks
Maternal Age At EDD: 35.9 yr
OSBR Risk 1 IN: 5182
Test Results:: NEGATIVE
Weight: 346 [lb_av]

## 2022-04-01 ENCOUNTER — Ambulatory Visit (INDEPENDENT_AMBULATORY_CARE_PROVIDER_SITE_OTHER): Payer: Medicaid Other | Admitting: Family Medicine

## 2022-04-01 ENCOUNTER — Encounter: Payer: Self-pay | Admitting: Family Medicine

## 2022-04-01 VITALS — BP 118/75 | HR 86 | Wt 349.0 lb

## 2022-04-01 DIAGNOSIS — Z6841 Body Mass Index (BMI) 40.0 and over, adult: Secondary | ICD-10-CM

## 2022-04-01 DIAGNOSIS — Z98891 History of uterine scar from previous surgery: Secondary | ICD-10-CM

## 2022-04-01 DIAGNOSIS — O99212 Obesity complicating pregnancy, second trimester: Secondary | ICD-10-CM

## 2022-04-01 DIAGNOSIS — O09522 Supervision of elderly multigravida, second trimester: Secondary | ICD-10-CM

## 2022-04-01 DIAGNOSIS — Z3482 Encounter for supervision of other normal pregnancy, second trimester: Secondary | ICD-10-CM

## 2022-04-01 DIAGNOSIS — Z3480 Encounter for supervision of other normal pregnancy, unspecified trimester: Secondary | ICD-10-CM

## 2022-04-01 NOTE — Progress Notes (Signed)
PRENATAL VISIT NOTE  Subjective:  Brooke Bryant is a 36 y.o. G2P1001 at [redacted]w[redacted]d being seen today for ongoing prenatal care.  She is currently monitored for the following issues for this high-risk pregnancy and has Leukocytosis; Supervision of other normal pregnancy, antepartum; BMI 50.0-59.9, adult (HCC); Obesity complicating pregnancy; Advanced maternal age in multigravida; and History of cesarean delivery on their problem list.  Patient reports no complaints.  Contractions: Not present. Vag. Bleeding: None.  Movement: Present. Denies leaking of fluid.   The following portions of the patient's history were reviewed and updated as appropriate: allergies, current medications, past family history, past medical history, past social history, past surgical history and problem list.   Objective:   Vitals:   04/01/22 1444  BP: 118/75  Pulse: 86  Weight: (!) 349 lb (158.3 kg)    Fetal Status: Fetal Heart Rate (bpm): 134   Movement: Present     General:  Alert, oriented and cooperative. Patient is in no acute distress.  Skin: Skin is warm and dry. No rash noted.   Cardiovascular: Normal heart rate noted  Respiratory: Normal respiratory effort, no problems with respiration noted  Abdomen: Soft, gravid, appropriate for gestational age.  Pain/Pressure: Absent     Pelvic: Cervical exam deferred        Extremities: Normal range of motion.  Edema: None  Mental Status: Normal mood and affect. Normal behavior. Normal judgment and thought content.   Assessment and Plan:  Pregnancy: G2P1001 at [redacted]w[redacted]d  1. Supervision of other normal pregnancy, antepartum - [place] MFM OB DETAIL +14 WK; Future  2. Obesity affecting pregnancy in second trimester - [place] MFM OB DETAIL +14 WK; Future  3. Multigravida of advanced maternal age in second trimester - [place] MFM OB DETAIL +14 WK; Future  4. BMI 50.0-59.9, adult (HCC)  5. History of cesarean delivery    Nursing Staff Provider  Office Location [facility]  Dating  LMP EDD 11/11/21  [facility] [place] Model Arly.Keller ] Traditional [ ]  Centering [ ]  Mom-Baby Dyad    Language  English Anatomy    Flu Vaccine   Genetic/Carrier Screen  NIPS:   LR Female AFP:   NEG Horizon: negative  TDaP Vaccine    Hgb A1C or  GTT Early : A1c 5.7 Third trimester   COVID Vaccine Vaccinated   LAB RESULTS   Rhogam   Blood Type B/Positive/-- (07/06 1529)   Baby Feeding Plan Breast Antibody Negative (07/06 1529)  Contraception BTL? Rubella 5.68 (07/06 1529)  Circumcision n/a RPR Non Reactive (07/06 1529)   Pediatrician  Undecided HBsAg Negative (07/06 1529)   Support Person Husband HCVAb   negative  Prenatal Classes  HIV Non Reactive (07/06 1529)     BTL Consent  GBS   (For PCN allergy, check sensitivities)   VBAC Consent  Pap        DME Rx 11-21-1993 ] BP cuff 11-21-1993 ] Weight Scale Waterbirth  [ ]  Class [ ]  Consent [ ]  CNM visit  PHQ9 & GAD7 Arly.Keller  ] new OB [  ] 28 weeks  [  ] 36 weeks Induction  [ ]  Orders Entered [ ] Foley Y/N    Preterm labor symptoms and general obstetric precautions including but not limited to vaginal bleeding, contractions, leaking of fluid and fetal movement were reviewed in detail with the patient. Please refer to After Visit Summary for other counseling recommendations.   Return in about 4 weeks (around 04/29/2022) for ROB.  No future appointments.  Myrtie Hawk, DO FMOB Fellow, Faculty practice [facility], Center for [facility] 04/01/22  2:59 PM

## 2022-04-23 ENCOUNTER — Other Ambulatory Visit: Payer: Self-pay | Admitting: *Deleted

## 2022-04-23 ENCOUNTER — Ambulatory Visit: Payer: BC Managed Care – PPO | Attending: Family Medicine

## 2022-04-23 ENCOUNTER — Ambulatory Visit: Payer: Medicaid Other | Attending: Obstetrics | Admitting: Obstetrics

## 2022-04-23 ENCOUNTER — Encounter: Payer: Self-pay | Admitting: *Deleted

## 2022-04-23 ENCOUNTER — Ambulatory Visit: Payer: BC Managed Care – PPO | Admitting: *Deleted

## 2022-04-23 VITALS — BP 113/55 | HR 81

## 2022-04-23 DIAGNOSIS — Z3A23 23 weeks gestation of pregnancy: Secondary | ICD-10-CM | POA: Diagnosis not present

## 2022-04-23 DIAGNOSIS — O09522 Supervision of elderly multigravida, second trimester: Secondary | ICD-10-CM | POA: Diagnosis not present

## 2022-04-23 DIAGNOSIS — Z3480 Encounter for supervision of other normal pregnancy, unspecified trimester: Secondary | ICD-10-CM | POA: Diagnosis not present

## 2022-04-23 DIAGNOSIS — O99212 Obesity complicating pregnancy, second trimester: Secondary | ICD-10-CM

## 2022-04-23 DIAGNOSIS — Z363 Encounter for antenatal screening for malformations: Secondary | ICD-10-CM | POA: Diagnosis not present

## 2022-04-23 NOTE — Progress Notes (Signed)
MFM Note  Brooke Bryant was seen for a detailed fetal anatomy scan due to advanced maternal age and maternal obesity with a BMI of 58.  She denies any significant past medical history and denies any problems in her current pregnancy.    She had a cell free DNA test earlier in her pregnancy which indicated a low risk for trisomy 82, 73, and 13. A female fetus is predicted.   She was informed that the fetal growth and amniotic fluid level were appropriate for her gestational age.   There were no obvious fetal anomalies noted on today's ultrasound exam.  However, today's exam was limited due to the fetal position and maternal body habitus.  The patient was informed that anomalies may be missed due to technical limitations. If the fetus is in a suboptimal position or maternal habitus is increased, visualization of the fetus in the maternal uterus may be impaired.  The following were discussed during today's consultation:  Obesity in pregnancy  The recommended total weight gain in pregnancy for obese women's between 10 to 20 pounds.  Due to obesity, she should be screened for gestational diabetes at between 12 to 28 weeks.   As maternal obesity may present challenges associated with the management of anesthesia, an anesthesia consult should be obtained when she is admitted in labor.  We will continue to follow her with monthly growth scans.    Weekly fetal testing should be started at around 34 weeks.    She was advised to continue taking a daily baby aspirin for preeclampsia prophylaxis.  Advanced maternal age in pregnancy  The patient was comfortable with the low risk indicated by her cell free DNA test and declined any further testing.  The patient stated that all of her questions have been answered.   A follow-up exam was scheduled in 4 weeks to complete the views of the fetal anatomy.  A total of 30 minutes was spent counseling and coordinating the care for this patient.   Greater than 50% of the time was spent in direct face-to-face contact.

## 2022-04-26 ENCOUNTER — Encounter: Payer: Self-pay | Admitting: *Deleted

## 2022-04-28 ENCOUNTER — Ambulatory Visit (INDEPENDENT_AMBULATORY_CARE_PROVIDER_SITE_OTHER): Payer: Medicaid Other | Admitting: Student

## 2022-04-28 VITALS — BP 119/76 | HR 84 | Wt 345.0 lb

## 2022-04-28 DIAGNOSIS — O09522 Supervision of elderly multigravida, second trimester: Secondary | ICD-10-CM

## 2022-04-28 DIAGNOSIS — Z3A24 24 weeks gestation of pregnancy: Secondary | ICD-10-CM

## 2022-04-28 DIAGNOSIS — O34211 Maternal care for low transverse scar from previous cesarean delivery: Secondary | ICD-10-CM

## 2022-04-28 DIAGNOSIS — O99212 Obesity complicating pregnancy, second trimester: Secondary | ICD-10-CM

## 2022-04-28 DIAGNOSIS — Z6841 Body Mass Index (BMI) 40.0 and over, adult: Secondary | ICD-10-CM

## 2022-04-28 DIAGNOSIS — Z3480 Encounter for supervision of other normal pregnancy, unspecified trimester: Secondary | ICD-10-CM

## 2022-04-28 DIAGNOSIS — Z98891 History of uterine scar from previous surgery: Secondary | ICD-10-CM

## 2022-04-28 NOTE — Progress Notes (Signed)
Pt presents for ROB. 

## 2022-04-28 NOTE — Progress Notes (Signed)
   PRENATAL VISIT NOTE  Subjective:  Brooke Bryant is a 36 y.o. G2P1001 at [redacted]w[redacted]d being seen today for ongoing prenatal care.  She is currently monitored for the following issues for this high-risk pregnancy and has Leukocytosis; Supervision of other normal pregnancy, antepartum; BMI 50.0-59.9, adult ([place]); Obesity complicating pregnancy; Advanced maternal age in multigravida; and History of cesarean delivery on their problem list.  Patient reports  abdominal itching , but no rash or skin changes.  Contractions: Not present. Vag. Bleeding: None.  Movement: Present. Denies leaking of fluid.   The following portions of the patient's history were reviewed and updated as appropriate: allergies, current medications, past family history, past medical history, past social history, past surgical history and problem list.   Objective:   Vitals:   04/28/22 1330  BP: 119/76  Pulse: 84  Weight: (!) 345 lb (156.5 kg)    Fetal Status: Fetal Heart Rate (bpm): 135   Movement: Present     General:  Alert, oriented and cooperative. Patient is in no acute distress.  Skin: Skin is warm and dry. No rash noted.   Cardiovascular: Normal heart rate noted  Respiratory: Normal respiratory effort, no problems with respiration noted  Abdomen: Soft, gravid, appropriate for gestational age.  Pain/Pressure: Absent     Pelvic: Cervical exam deferred        Extremities: Normal range of motion.  Edema: None  Mental Status: Normal mood and affect. Normal behavior. Normal judgment and thought content.   Assessment and Plan:  Pregnancy: G2P1001 at [redacted]w[redacted]d 1. Supervision of other normal pregnancy, antepartum - Doing well, reports fetal movement - FHT WDL  2. [redacted] weeks gestation of pregnancy - GTT at next visit  3. Obesity affecting pregnancy in second trimester 4. BMI 50.0-59.9, adult (HCC) - taking daily aspirin  5. Multigravida of advanced maternal age in second trimester - Follow-up growth U/S scheduled   - low risk genetic screening  6. History of cesarean delivery - Verbalized a desire for repeat C/S  Preterm labor symptoms and general obstetric precautions including but not limited to vaginal bleeding, contractions, leaking of fluid and fetal movement were reviewed in detail with the patient. Please refer to After Visit Summary for other counseling recommendations.   Return in about 4 weeks (around 05/26/2022) for HOB/GTT, IN-PERSON.  Future Appointments  Date Time Provider [place]  04/30/2022  1:30 PM [facility] NURSE [facility]  05/21/2022  2:30 PM [facility] NURSE [facility]  05/21/2022  2:45 PM [facility] US4 [facility]  05/26/2022  8:00 AM [facility] LAB [facility] None  05/26/2022  8:35 AM Chancy Milroy, MD [place] None    Johnston Ebbs, NP

## 2022-04-30 ENCOUNTER — Ambulatory Visit (INDEPENDENT_AMBULATORY_CARE_PROVIDER_SITE_OTHER): Payer: BC Managed Care – PPO | Admitting: *Deleted

## 2022-04-30 ENCOUNTER — Ambulatory Visit: Payer: BC Managed Care – PPO

## 2022-04-30 DIAGNOSIS — Z23 Encounter for immunization: Secondary | ICD-10-CM | POA: Diagnosis not present

## 2022-05-21 ENCOUNTER — Ambulatory Visit: Payer: BC Managed Care – PPO | Admitting: *Deleted

## 2022-05-21 ENCOUNTER — Ambulatory Visit: Payer: BC Managed Care – PPO | Attending: Obstetrics

## 2022-05-21 VITALS — BP 122/67 | HR 91

## 2022-05-21 DIAGNOSIS — O99212 Obesity complicating pregnancy, second trimester: Secondary | ICD-10-CM | POA: Diagnosis not present

## 2022-05-21 DIAGNOSIS — O34219 Maternal care for unspecified type scar from previous cesarean delivery: Secondary | ICD-10-CM | POA: Diagnosis not present

## 2022-05-21 DIAGNOSIS — Z3A27 27 weeks gestation of pregnancy: Secondary | ICD-10-CM | POA: Insufficient documentation

## 2022-05-21 DIAGNOSIS — O09522 Supervision of elderly multigravida, second trimester: Secondary | ICD-10-CM | POA: Insufficient documentation

## 2022-05-21 DIAGNOSIS — O321XX0 Maternal care for breech presentation, not applicable or unspecified: Secondary | ICD-10-CM | POA: Diagnosis not present

## 2022-05-21 DIAGNOSIS — E669 Obesity, unspecified: Secondary | ICD-10-CM | POA: Diagnosis not present

## 2022-05-21 DIAGNOSIS — Z3480 Encounter for supervision of other normal pregnancy, unspecified trimester: Secondary | ICD-10-CM | POA: Diagnosis not present

## 2022-05-24 ENCOUNTER — Other Ambulatory Visit: Payer: Self-pay | Admitting: *Deleted

## 2022-05-24 DIAGNOSIS — O09522 Supervision of elderly multigravida, second trimester: Secondary | ICD-10-CM

## 2022-05-24 DIAGNOSIS — O99212 Obesity complicating pregnancy, second trimester: Secondary | ICD-10-CM

## 2022-05-24 DIAGNOSIS — R638 Other symptoms and signs concerning food and fluid intake: Secondary | ICD-10-CM

## 2022-05-26 ENCOUNTER — Encounter: Payer: Self-pay | Admitting: Obstetrics and Gynecology

## 2022-05-26 ENCOUNTER — Other Ambulatory Visit: Payer: BC Managed Care – PPO

## 2022-05-26 ENCOUNTER — Ambulatory Visit (INDEPENDENT_AMBULATORY_CARE_PROVIDER_SITE_OTHER): Payer: BC Managed Care – PPO | Admitting: Obstetrics and Gynecology

## 2022-05-26 VITALS — BP 110/77 | HR 91 | Wt 350.0 lb

## 2022-05-26 DIAGNOSIS — Z3483 Encounter for supervision of other normal pregnancy, third trimester: Secondary | ICD-10-CM | POA: Diagnosis not present

## 2022-05-26 DIAGNOSIS — Z23 Encounter for immunization: Secondary | ICD-10-CM | POA: Diagnosis not present

## 2022-05-26 DIAGNOSIS — O09522 Supervision of elderly multigravida, second trimester: Secondary | ICD-10-CM

## 2022-05-26 DIAGNOSIS — O09523 Supervision of elderly multigravida, third trimester: Secondary | ICD-10-CM

## 2022-05-26 DIAGNOSIS — Z98891 History of uterine scar from previous surgery: Secondary | ICD-10-CM

## 2022-05-26 DIAGNOSIS — Z3480 Encounter for supervision of other normal pregnancy, unspecified trimester: Secondary | ICD-10-CM | POA: Diagnosis not present

## 2022-05-26 DIAGNOSIS — Z3A28 28 weeks gestation of pregnancy: Secondary | ICD-10-CM | POA: Diagnosis not present

## 2022-05-26 NOTE — Patient Instructions (Signed)

## 2022-05-26 NOTE — Progress Notes (Signed)
Subjective:  Brooke Bryant is a 35 y.o. G2P1001 at [redacted]w[redacted]d being seen today for ongoing prenatal care.  She is currently monitored for the following issues for this high-risk pregnancy and has Supervision of other normal pregnancy, antepartum; BMI 50.0-59.9, adult ([place]); Obesity complicating pregnancy; Advanced maternal age in multigravida; and History of cesarean delivery on their problem list.  Patient reports no complaints.  Contractions: Not present. Vag. Bleeding: None.  Movement: Present. Denies leaking of fluid.   The following portions of the patient's history were reviewed and updated as appropriate: allergies, current medications, past family history, past medical history, past social history, past surgical history and problem list. Problem list updated.  Objective:   Vitals:   05/26/22 0823  BP: 110/77  Pulse: 91  Weight: (!) 350 lb (158.8 kg)    Fetal Status: Fetal Heart Rate (bpm): 144   Movement: Present     General:  Alert, oriented and cooperative. Patient is in no acute distress.  Skin: Skin is warm and dry. No rash noted.   Cardiovascular: Normal heart rate noted  Respiratory: Normal respiratory effort, no problems with respiration noted  Abdomen: Soft, gravid, appropriate for gestational age. Pain/Pressure: Absent     Pelvic:  Cervical exam deferred        Extremities: Normal range of motion.  Edema: None  Mental Status: Normal mood and affect. Normal behavior. Normal judgment and thought content.   Urinalysis:      Assessment and Plan:  Pregnancy: G2P1001 at [redacted]w[redacted]d  1. Supervision of other normal pregnancy, antepartum Stable Glucola and 28 week labs today  2. History of cesarean delivery Desires repeat, consent today  3. Multigravida of advanced maternal age in second trimester Genetic testing negative Serial growth scans and antenatal testing as per protocol  Preterm labor symptoms and general obstetric precautions including but not limited to  vaginal bleeding, contractions, leaking of fluid and fetal movement were reviewed in detail with the patient. Please refer to After Visit Summary for other counseling recommendations.  No follow-ups on file.   Chancy Milroy, MD

## 2022-05-26 NOTE — Progress Notes (Signed)
ROB/GTT.  TDap given in LD, tolerated well.  Reports no concerns today.

## 2022-05-27 LAB — CBC
Hematocrit: 34.9 % (ref 34.0–46.6)
Hemoglobin: 11.7 g/dL (ref 11.1–15.9)
MCH: 29 pg (ref 26.6–33.0)
MCHC: 33.5 g/dL (ref 31.5–35.7)
MCV: 86 fL (ref 79–97)
Platelets: 275 10*3/uL (ref 150–450)
RBC: 4.04 x10E6/uL (ref 3.77–5.28)
RDW: 12.2 % (ref 11.7–15.4)
WBC: 10.2 10*3/uL (ref 3.4–10.8)

## 2022-05-27 LAB — RPR: RPR Ser Ql: NONREACTIVE

## 2022-05-27 LAB — GLUCOSE TOLERANCE, 2 HOURS W/ 1HR
Glucose, 1 hour: 122 mg/dL (ref 70–179)
Glucose, 2 hour: 93 mg/dL (ref 70–152)
Glucose, Fasting: 78 mg/dL (ref 70–91)

## 2022-05-27 LAB — HIV ANTIBODY (ROUTINE TESTING W REFLEX): HIV Screen 4th Generation wRfx: NONREACTIVE

## 2022-06-04 ENCOUNTER — Ambulatory Visit (INDEPENDENT_AMBULATORY_CARE_PROVIDER_SITE_OTHER): Payer: BC Managed Care – PPO | Admitting: Physician Assistant

## 2022-06-04 ENCOUNTER — Encounter: Payer: Self-pay | Admitting: Physician Assistant

## 2022-06-04 VITALS — Temp 97.7°F

## 2022-06-04 DIAGNOSIS — U071 COVID-19: Secondary | ICD-10-CM | POA: Diagnosis not present

## 2022-06-04 DIAGNOSIS — Z3A29 29 weeks gestation of pregnancy: Secondary | ICD-10-CM

## 2022-06-04 LAB — POC COVID19 BINAXNOW: SARS Coronavirus 2 Ag: POSITIVE — AB

## 2022-06-04 MED ORDER — NIRMATRELVIR/RITONAVIR (PAXLOVID)TABLET: 3.0000 | ORAL_TABLET | Freq: Two times a day (BID) | ORAL | 0 refills | Status: AC

## 2022-06-04 NOTE — Progress Notes (Signed)
Brooke Bryant is a 36 y.o. female here for a new problem.  History of Present Illness:   Chief Complaint  Patient presents with   Covid Positive    Pt c/o slight headache, nasal congestion, had fever last night 100.1 took Tylenol. Has some cough and expectorating yellow sputum.    HPI  COVID-19 Patient had sudden onset of symptoms starting yesterday.  She has had fever, chills, fatigue, back pain, cough and congestion. Has not been taking anything for her symptoms outside of Tylenol. She had a coworker that exposed her to [place]. She is currently [redacted] weeks pregnant. Denies shortness of breath, chest pain, lower extremity swelling. She is currently taking a daily aspirin per her OB/GYN's recommendation. She has received COVID vaccinations.  Past Medical History:  Diagnosis Date   Allergy    Arthritis    Left knee     Social History   Tobacco Use   Smoking status: Former    Packs/day: 1.00    Years: 5.00    Total pack years: 5.00    Types: Cigarettes    Start date: 11/10/2001    Quit date: 08/02/2014    Years since quitting: 7.8   Smokeless tobacco: Never  Vaping Use   Vaping Use: Never used  Substance Use Topics   Alcohol use: Not Currently    Alcohol/week: 3.0 standard drinks of alcohol    Types: 3 Shots of liquor per week    Comment: Not since confirmed pregnancy   Drug use: No    Past Surgical History:  Procedure Laterality Date   CESAREAN SECTION N/A 02/11/2018   Procedure: CESAREAN SECTION;  Surgeon: Florian Buff, MD;  Location: [place];  Service: Obstetrics;  Laterality: N/A;   GUM SURGERY     KNEE SURGERY Left     Family History  Problem Relation Age of Onset   Depression Mother    Varicose Veins Mother    Kidney disease Mother    Heart disease Father    Hypertension Father    Arthritis Maternal Grandfather    Cancer Paternal Aunt        breast cancer     No Known Allergies  Current Medications:   Current Outpatient  Medications:    aspirin 81 MG chewable tablet, Chew 1 tablet (81 mg total) by mouth daily. Start at 14-15 weeks, Disp: 30 tablet, Rfl: 7   nirmatrelvir/ritonavir EUA (PAXLOVID) 20 x 150 MG & 10 x 100MG  TABS, Take 3 tablets by mouth 2 (two) times daily for 5 days. (Take nirmatrelvir 150 mg two tablets twice daily for 5 days and ritonavir 100 mg one tablet twice daily for 5 days) Patient GFR is normal, Disp: 30 tablet, Rfl: 0   Prenatal MV & Min w/FA-DHA (PRENATAL GUMMIES PO), Take 2 tablets by mouth daily., Disp: , Rfl:    Review of Systems:   ROS Negative unless otherwise specified per HPI.  Vitals:   Vitals:   06/04/22 1139  Temp: 97.7 F (36.5 C)  TempSrc: Temporal     There is no height or weight on file to calculate BMI.  Physical Exam:   Physical Exam Vitals and nursing note reviewed.  Constitutional:      General: She is not in acute distress.    Appearance: She is well-developed. She is not ill-appearing or toxic-appearing.  Cardiovascular:     Rate and Rhythm: Normal rate and regular rhythm.     Pulses: Normal pulses.  Heart sounds: Normal heart sounds, S1 normal and S2 normal.  Pulmonary:     Effort: Pulmonary effort is normal.     Breath sounds: Examination of the right-upper field reveals decreased breath sounds. Examination of the right-middle field reveals decreased breath sounds. Examination of the right-lower field reveals decreased breath sounds. Decreased breath sounds present. No wheezing, rhonchi or rales.  Skin:    General: Skin is warm and dry.  Neurological:     Mental Status: She is alert.     GCS: GCS eye subscore is 4. GCS verbal subscore is 5. GCS motor subscore is 6.  Psychiatric:        Speech: Speech normal.        Behavior: Behavior normal. Behavior is cooperative.     Assessment and Plan:   COVID-19 virus RNA test result positive at limit of detection; [redacted] weeks gestation of pregnancy Denies any current red flag symptoms She appears  clinically stable and without any life-threatening symptoms at this time Discussed trialing Claritin and Delsym for her symptoms Did discuss option to take Paxlovid given her obesity, discussed risk versus benefits She is going to consider this medication and has asked that I go ahead and send this in for her in case she decides to take it I also recommended that she reach out to her OB/GYN to see if they have any further recommendations or have specific guidelines on whether or not they prefer her to take Paxlovid Low threshold to go to urgent care or ER if any worsening symptoms over the weekend  Jarold Motto, [place]

## 2022-06-09 ENCOUNTER — Ambulatory Visit (INDEPENDENT_AMBULATORY_CARE_PROVIDER_SITE_OTHER): Payer: BC Managed Care – PPO | Admitting: Student

## 2022-06-09 ENCOUNTER — Encounter: Payer: Self-pay | Admitting: Student

## 2022-06-09 VITALS — BP 125/72 | HR 83 | Wt 347.6 lb

## 2022-06-09 DIAGNOSIS — O09522 Supervision of elderly multigravida, second trimester: Secondary | ICD-10-CM

## 2022-06-09 DIAGNOSIS — O99213 Obesity complicating pregnancy, third trimester: Secondary | ICD-10-CM

## 2022-06-09 DIAGNOSIS — Z98891 History of uterine scar from previous surgery: Secondary | ICD-10-CM

## 2022-06-09 DIAGNOSIS — Z6841 Body Mass Index (BMI) 40.0 and over, adult: Secondary | ICD-10-CM

## 2022-06-09 DIAGNOSIS — Z3A30 30 weeks gestation of pregnancy: Secondary | ICD-10-CM

## 2022-06-09 DIAGNOSIS — Z3480 Encounter for supervision of other normal pregnancy, unspecified trimester: Secondary | ICD-10-CM

## 2022-06-09 DIAGNOSIS — Z3483 Encounter for supervision of other normal pregnancy, third trimester: Secondary | ICD-10-CM

## 2022-06-09 NOTE — Progress Notes (Signed)
Pt presents for ROB visit. No concerns at this time.  

## 2022-06-09 NOTE — Progress Notes (Signed)
   PRENATAL VISIT NOTE  Subjective:  Brooke Bryant is a 36 y.o. G2P1001 at [redacted]w[redacted]d being seen today for ongoing prenatal care.  She is currently monitored for the following issues for this high-risk pregnancy and has Supervision of other normal pregnancy, antepartum; BMI 50.0-59.9, adult (HCC); Obesity complicating pregnancy; Advanced maternal age in multigravida; and History of cesarean delivery on their problem list.  Patient reports no complaints. Patient has a few questions about delivery planning. Contractions: Not present. Vag. Bleeding: None.  Movement: Present. Denies leaking of fluid.   The following portions of the patient's history were reviewed and updated as appropriate: allergies, current medications, past family history, past medical history, past social history, past surgical history and problem list.   Objective:   Vitals:   06/09/22 1402  BP: 125/72  Pulse: 83  Weight: (!) 347 lb 9.6 oz (157.7 kg)    Fetal Status: Fetal Heart Rate (bpm): 133   Movement: Present     General:  Alert, oriented and cooperative. Patient is in no acute distress.  Skin: Skin is warm and dry. No rash noted.   Cardiovascular: Normal heart rate noted  Respiratory: Normal respiratory effort, no problems with respiration noted  Abdomen: Soft, gravid, appropriate for gestational age.  Pain/Pressure: Absent     Pelvic: Cervical exam deferred        Extremities: Normal range of motion.  Edema: None  Mental Status: Normal mood and affect. Normal behavior. Normal judgment and thought content.   Assessment and Plan:  Pregnancy: G2P1001 at [redacted]w[redacted]d  1. Supervision of other normal pregnancy, antepartum - Doing well, frequent and vigorous fetal movement - Questions addressed 2. Multigravida of advanced maternal age in second trimester 3. BMI 50.0-59.9, adult (HCC) 4. Severe obesity due to excess calories affecting pregnancy in second trimester [facility]) - Has a repeat growth U/S scheduled for  06/28/22 - Current plan is for weekly BPP starting at 34 weeks until delivery  5. History of cesarean delivery - Plan for repeat, Consented on 05/26/22   Preterm labor symptoms and general obstetric precautions including but not limited to vaginal bleeding, contractions, leaking of fluid and fetal movement were reviewed in detail with the patient. Please refer to After Visit Summary for other counseling recommendations.   Return in about 2 weeks (around 06/23/2022) for IN-PERSON, HOB.  Future Appointments  Date Time Provider Department Center  06/23/2022  1:30 PM Brock Bad, MD [facility] None  06/28/2022  1:30 PM [facility] NURSE [facility]  06/28/2022  1:45 PM [facility] US6 [facility]  07/07/2022  1:30 PM Corlis Hove, NP [facility] None  07/09/2022  2:30 PM [facility] NURSE [facility]  07/09/2022  2:45 PM [facility] US5 [facility]  07/16/2022  1:15 PM [facility] NURSE [facility]  07/16/2022  1:30 PM [facility] US2 [facility]  07/21/2022  1:30 PM Adam Phenix, MD [facility] None  07/28/2022  1:30 PM Lennart Pall, MD [facility] None    Corlis Hove, NP

## 2022-06-14 ENCOUNTER — Other Ambulatory Visit: Payer: Self-pay

## 2022-06-14 ENCOUNTER — Encounter (HOSPITAL_BASED_OUTPATIENT_CLINIC_OR_DEPARTMENT_OTHER): Payer: Self-pay

## 2022-06-14 ENCOUNTER — Emergency Department (HOSPITAL_BASED_OUTPATIENT_CLINIC_OR_DEPARTMENT_OTHER): Payer: BC Managed Care – PPO | Admitting: Radiology

## 2022-06-14 ENCOUNTER — Emergency Department (HOSPITAL_BASED_OUTPATIENT_CLINIC_OR_DEPARTMENT_OTHER)
Admission: EM | Admit: 2022-06-14 | Discharge: 2022-06-14 | Disposition: A | Discharge status: Home / Self Care | Payer: BC Managed Care – PPO | Attending: Emergency Medicine | Admitting: Emergency Medicine

## 2022-06-14 DIAGNOSIS — X501XXA Overexertion from prolonged static or awkward postures, initial encounter: Secondary | ICD-10-CM | POA: Diagnosis not present

## 2022-06-14 DIAGNOSIS — Z7982 Long term (current) use of aspirin: Secondary | ICD-10-CM | POA: Diagnosis not present

## 2022-06-14 DIAGNOSIS — G8911 Acute pain due to trauma: Secondary | ICD-10-CM | POA: Diagnosis not present

## 2022-06-14 DIAGNOSIS — M25562 Pain in left knee: Secondary | ICD-10-CM | POA: Diagnosis not present

## 2022-06-14 DIAGNOSIS — M1712 Unilateral primary osteoarthritis, left knee: Secondary | ICD-10-CM | POA: Diagnosis not present

## 2022-06-14 NOTE — Discharge Instructions (Signed)
Return to ED with any new or worsening signs or symptoms Please utilize knee brace, crutches Please read attached guide concerning acute knee pain Please continue taking ibuprofen for pain control Please follow-up with Dr. Clare Gandy.  Please call make an appointment to be seen.

## 2022-06-14 NOTE — ED Provider Notes (Signed)
[facility] EMERGENCY DEPT Provider Note   CSN: [id number] Arrival date & time: 06/14/22  1937     History  Chief Complaint  Patient presents with   Knee Pain    Brooke Bryant is a 36 y.o. female with medical history of allergy, arthritis, knee surgery in middle school.  Patient presents to the ED for evaluation of left knee pain.  Patient reports that prior to arrival she was stepping down onto a step when she felt a pop in her left knee and had immediate pain.  The patient states that she is now unable to bear weight on her knee.  Patient reports remote history of left knee operation in middle school however cannot tell me why, states that she did have a piece of chipped bone left in her knee at this time.   Knee Pain      Home Medications Prior to Admission medications   Medication Sig Start Date End Date Taking? Authorizing Provider  aspirin 81 MG chewable tablet Chew 1 tablet (81 mg total) by mouth daily. Start at 14-15 weeks 02/04/22   Warden Fillers, MD  Prenatal MV & Min w/FA-DHA (PRENATAL GUMMIES PO) Take 2 tablets by mouth daily.    [provider]      Allergies    Patient has no known allergies.    Review of Systems   Review of Systems  Musculoskeletal:  Positive for arthralgias.  All other systems reviewed and are negative.   Physical Exam Updated Vital Signs BP (!) 152/82 (BP Location: Right Wrist)   Pulse 85   Temp 97.6 F (36.4 C)   Resp 18   Ht 5\' 5"  (1.651 m)   Wt (!) 157.4 kg   LMP 11/11/2021   SpO2 100%   BMI 57.74 kg/m  Physical Exam Vitals and nursing note reviewed.  Constitutional:      General: She is not in acute distress.    Appearance: She is well-developed.  HENT:     Head: Normocephalic and atraumatic.  Eyes:     Conjunctiva/sclera: Conjunctivae normal.  Cardiovascular:     Rate and Rhythm: Normal rate and regular rhythm.     Heart sounds: No murmur heard. Pulmonary:     Effort: Pulmonary  effort is normal. No respiratory distress.     Breath sounds: Normal breath sounds.  Abdominal:     Palpations: Abdomen is soft.     Tenderness: There is no abdominal tenderness.  Musculoskeletal:        General: No swelling.     Cervical back: Neck supple.     Comments: No overlying skin change to patient left knee.  No obvious deformity.  The patient is able to flex and extend the knee without issue.  Patient has no varus or valgus pain on examination.  Patient Lachman test negative.  Skin:    General: Skin is warm and dry.     Capillary Refill: Capillary refill takes less than 2 seconds.  Neurological:     Mental Status: She is alert.  Psychiatric:        Mood and Affect: Mood normal.     ED Results / Procedures / Treatments   Labs (all labs ordered are listed, but only abnormal results are displayed) Labs Reviewed - No data to display  EKG None  Radiology DG Knee Complete 4 Views Left  Result Date: 06/14/2022 CLINICAL DATA:  Missed a step and twisted left knee.  Heard a pop. EXAM: LEFT  KNEE - COMPLETE 4+ VIEW COMPARISON:  None Available. FINDINGS: Moderate inferior patellar and peripheral lateral compartment degenerative osteophytes. No significant joint space narrowing. No joint effusion. No acute fracture or dislocation. Chronic well corticated 10 mm ossicle just anterior to the tibial plateau on lateral view, possibly a loose body. IMPRESSION: 1. Mild patellofemoral and lateral compartment osteoarthritis. 2. Possible 10 mm loose body within the anterior joint space. Electronically Signed   By: Neita Garnet M.D.   On: 06/14/2022 20:25    Procedures Procedures   Medications Ordered in ED Medications - No data to display  ED Course/ Medical Decision Making/ A&P                           Medical Decision Making Amount and/or Complexity of Data Reviewed Radiology: ordered.   36 year old female presents to ED for evaluation.  Please see HPI for further details.  On  my examination the patient left knee has no overlying skin change.  The patient left knee is able to flex and extend without difficulty.  The patient has knee superiorly to her patella however there is no deformity.  The patient has negative varus valgus stress testing, negative Lachman's test.  Patient plain film imaging shows bone fragment in left knee however patient reports that she has history of chipped bone in this knee, surgery.  At this time, patient will be placed in knee brace and provided with crutches.  The patient will be referred to sports medicine for further management.  Patient had all her questions answered to her satisfaction.  The patient is stable for discharge.  Final Clinical Impression(s) / ED Diagnoses Final diagnoses:  Acute pain of left knee    Rx / DC Orders ED Discharge Orders     None         Al Decant, PA-C 06/14/22 2323    Pricilla Loveless, MD 06/15/22 1705

## 2022-06-14 NOTE — ED Triage Notes (Signed)
Pt states that she missed a step and twisted her L knee and heard a pop.

## 2022-06-16 ENCOUNTER — Ambulatory Visit: Payer: BC Managed Care – PPO | Admitting: Sports Medicine

## 2022-06-16 ENCOUNTER — Ambulatory Visit: Payer: Self-pay

## 2022-06-16 VITALS — BP 134/56 | Ht 65.0 in | Wt 340.0 lb

## 2022-06-16 DIAGNOSIS — M25562 Pain in left knee: Secondary | ICD-10-CM

## 2022-06-16 NOTE — Progress Notes (Unsigned)
Brooke Bryant - 36 y.o. female MRN [id number]  Date of birth: 1986/05/04    CHIEF COMPLAINT:   L knee pain    SUBJECTIVE:   HPI:  Pleasant 36 year old female who is [redacted] weeks pregnant comes to clinic to be evaluated for left knee pain.  2 days ago she misstepped while going downstairs at her house.  She felt a pop in the knee.  She did not fall to the ground and was able to catch herself.  She immediately had pain in the anterior part of the knee.  She went to an emergency room where she had x-rays done.  These were negative for fracture.  There was a finding of a chronic well-corticated 10 mm ossicle anterior to the tibial plateau, possibly loose body.  She states she has a history of L knee surgery as a child and knows that has been there before.  She was given a knee brace and placed on crutches given follow-up here.  Today she says the knee is feeling a little bit better.  She still describes it as constant dull ache in the front.  She has been taking Tylenol for the pain.  She does not take NSAIDs due to her pregnancy.  She is only ambulating with 1 crutch now.  She can bend and straighten the knee but has some pain anteriorly.  The pain does not radiate.  No numbness or tingling down the leg.  ROS:     See HPI  PERTINENT  PMH / PSH FH / / SH:  Past Medical, Surgical, Social, and Family History Reviewed & Updated in the EMR.  Pertinent findings include:  Pregnant @ 31 wks  OBJECTIVE: BP (!) 134/56   Ht 5\' 5"  (1.651 m)   Wt (!) 340 lb (154.2 kg)   LMP 11/11/2021   BMI 56.58 kg/m   Physical Exam:  Vital signs are reviewed.  GEN: Alert and oriented, NAD Pulm: Breathing unlabored PSY: normal mood, congruent affect  MSK: L Knee -no obvious effusion.  Nontender to palpation at medial or lateral joint line or over the tibial tubercle.  She is mildly tender to palpation over the patella.  She has full range of motion in the knee.  5/5 strength in flexion and extension.  No  ligamentous instability with valgus or varus stressing.  Negative Lachman.  Negative anterior and posterior drawer.  Neurovascularly intact distally  ULTRASOUND: MSK ultrasound knee: Images were obtained both in the transverse and longitudinal plane. Patellar and quadriceps tendons were well visualized with no abnormalities. No effusion. Medial and lateral menisci were well visualized with no abnormalities. Popliteal fossa was evaluated in both the transverse and longitudinal planes. No obvious Baker's cyst  Impression: Unremarkable left knee ultrasound  ASSESSMENT & PLAN:  1. L Knee Sprain -This is an acute uncomplicated problem.  It is already starting to get better.  I do not see any red flags on her exam or ultrasound to raise suspicion for intra-articular pathology.  I encouraged her to wear knee brace as needed for comfort and wean off the crutch.  She can continue take Tylenol as needed.  She can follow-up here as needed.  If this does not get better, we can try an intra-articular corticosteroid injection or discuss pursuing physical therapy followed by an MRI to evaluate the loose body.  If necessary, this would likely be done in the postpartum period.  All her questions were answered and she agrees to the plan.  01/11/2022  Lagretta Loseke, MD PGY-4, Sports Medicine Fellow [facility]  Addendum:  Patient seen in the office by fellow.  His history, exam, plan of care were precepted with me.  Norton Blizzard MD Marrianne Mood

## 2022-06-17 ENCOUNTER — Ambulatory Visit: Payer: BC Managed Care – PPO | Admitting: Family Medicine

## 2022-06-23 ENCOUNTER — Encounter: Payer: Self-pay | Admitting: Obstetrics

## 2022-06-23 ENCOUNTER — Ambulatory Visit (INDEPENDENT_AMBULATORY_CARE_PROVIDER_SITE_OTHER): Payer: BC Managed Care – PPO | Admitting: Obstetrics

## 2022-06-23 VITALS — BP 109/68 | HR 97 | Wt 355.0 lb

## 2022-06-23 DIAGNOSIS — Z3A32 32 weeks gestation of pregnancy: Secondary | ICD-10-CM

## 2022-06-23 DIAGNOSIS — Z3483 Encounter for supervision of other normal pregnancy, third trimester: Secondary | ICD-10-CM

## 2022-06-23 DIAGNOSIS — O99210 Obesity complicating pregnancy, unspecified trimester: Secondary | ICD-10-CM

## 2022-06-23 DIAGNOSIS — O99212 Obesity complicating pregnancy, second trimester: Secondary | ICD-10-CM

## 2022-06-23 DIAGNOSIS — Z3480 Encounter for supervision of other normal pregnancy, unspecified trimester: Secondary | ICD-10-CM

## 2022-06-23 NOTE — Progress Notes (Signed)
Subjective:  Brooke Bryant is a 36 y.o. G2P1001 at [redacted]w[redacted]d being seen today for ongoing prenatal care.  She is currently monitored for the following issues for this low-risk pregnancy and has Supervision of other normal pregnancy, antepartum; BMI 50.0-59.9, adult (HCC); Obesity complicating pregnancy; Advanced maternal age in multigravida; and History of cesarean delivery on their problem list.  Patient reports occasional contractions.  Contractions: Not present. Vag. Bleeding: None.  Movement: Present. Denies leaking of fluid.   The following portions of the patient's history were reviewed and updated as appropriate: allergies, current medications, past family history, past medical history, past social history, past surgical history and problem list. Problem list updated.  Objective:   Vitals:   06/23/22 1320  BP: 109/68  Pulse: 97  Weight: (!) 355 lb (161 kg)    Fetal Status: Fetal Heart Rate (bpm): 130   Movement: Present     General:  Alert, oriented and cooperative. Patient is in no acute distress.  Skin: Skin is warm and dry. No rash noted.   Cardiovascular: Normal heart rate noted  Respiratory: Normal respiratory effort, no problems with respiration noted  Abdomen: Soft, gravid, appropriate for gestational age. Pain/Pressure: Present     Pelvic:  Cervical exam deferred        Extremities: Normal range of motion.     Mental Status: Normal mood and affect. Normal behavior. Normal judgment and thought content.   Urinalysis:      Assessment and Plan:  Pregnancy: G2P1001 at [redacted]w[redacted]d  1. Supervision of other normal pregnancy, antepartum  2. Obesity affecting pregnancy, antepartum, unspecified obesity type    There are no diagnoses linked to this encounter. Preterm labor symptoms and general obstetric precautions including but not limited to vaginal bleeding, contractions, leaking of fluid and fetal movement were reviewed in detail with the patient. Please refer to After  Visit Summary for other counseling recommendations.   Return in about 2 weeks (around 07/07/2022) for ROB.   Brock Bad, MD 06/23/22

## 2022-06-28 ENCOUNTER — Ambulatory Visit: Payer: BC Managed Care – PPO | Admitting: *Deleted

## 2022-06-28 ENCOUNTER — Ambulatory Visit: Payer: BC Managed Care – PPO | Attending: Obstetrics and Gynecology

## 2022-06-28 ENCOUNTER — Other Ambulatory Visit: Payer: Self-pay | Admitting: *Deleted

## 2022-06-28 ENCOUNTER — Encounter: Payer: Self-pay | Admitting: *Deleted

## 2022-06-28 VITALS — BP 134/68 | HR 89

## 2022-06-28 DIAGNOSIS — O34219 Maternal care for unspecified type scar from previous cesarean delivery: Secondary | ICD-10-CM

## 2022-06-28 DIAGNOSIS — O09522 Supervision of elderly multigravida, second trimester: Secondary | ICD-10-CM | POA: Insufficient documentation

## 2022-06-28 DIAGNOSIS — O99213 Obesity complicating pregnancy, third trimester: Secondary | ICD-10-CM | POA: Insufficient documentation

## 2022-06-28 DIAGNOSIS — O09523 Supervision of elderly multigravida, third trimester: Secondary | ICD-10-CM | POA: Insufficient documentation

## 2022-06-28 DIAGNOSIS — O99212 Obesity complicating pregnancy, second trimester: Secondary | ICD-10-CM | POA: Diagnosis not present

## 2022-06-28 DIAGNOSIS — R638 Other symptoms and signs concerning food and fluid intake: Secondary | ICD-10-CM | POA: Insufficient documentation

## 2022-06-28 DIAGNOSIS — Z3A32 32 weeks gestation of pregnancy: Secondary | ICD-10-CM | POA: Diagnosis not present

## 2022-06-28 DIAGNOSIS — Z3480 Encounter for supervision of other normal pregnancy, unspecified trimester: Secondary | ICD-10-CM | POA: Insufficient documentation

## 2022-07-07 ENCOUNTER — Encounter: Payer: Self-pay | Admitting: Student

## 2022-07-07 ENCOUNTER — Ambulatory Visit (INDEPENDENT_AMBULATORY_CARE_PROVIDER_SITE_OTHER): Payer: BC Managed Care – PPO | Admitting: Student

## 2022-07-07 VITALS — BP 136/86 | HR 86 | Wt 351.8 lb

## 2022-07-07 DIAGNOSIS — Z3480 Encounter for supervision of other normal pregnancy, unspecified trimester: Secondary | ICD-10-CM

## 2022-07-07 DIAGNOSIS — Z98891 History of uterine scar from previous surgery: Secondary | ICD-10-CM

## 2022-07-07 DIAGNOSIS — O09523 Supervision of elderly multigravida, third trimester: Secondary | ICD-10-CM

## 2022-07-07 DIAGNOSIS — Z3483 Encounter for supervision of other normal pregnancy, third trimester: Secondary | ICD-10-CM

## 2022-07-07 DIAGNOSIS — O09522 Supervision of elderly multigravida, second trimester: Secondary | ICD-10-CM

## 2022-07-07 DIAGNOSIS — Z3A34 34 weeks gestation of pregnancy: Secondary | ICD-10-CM

## 2022-07-07 DIAGNOSIS — Z6841 Body Mass Index (BMI) 40.0 and over, adult: Secondary | ICD-10-CM

## 2022-07-07 NOTE — Progress Notes (Signed)
   PRENATAL VISIT NOTE  Subjective:  Brooke Bryant is a 36 y.o. G2P1001 at [redacted]w[redacted]d being seen today for ongoing prenatal care.  She is currently monitored for the following issues for this high-risk pregnancy and has Supervision of other normal pregnancy, antepartum; BMI 50.0-59.9, adult (HCC); Obesity complicating pregnancy; Advanced maternal age in multigravida; and History of cesarean delivery on their problem list.  Patient reports no complaints.  Contractions: Not present. Vag. Bleeding: None.  Movement: Present. Denies leaking of fluid.   The following portions of the patient's history were reviewed and updated as appropriate: allergies, current medications, past family history, past medical history, past social history, past surgical history and problem list.   Objective:   Vitals:   07/07/22 1312  BP: 136/86  Pulse: 86  Weight: (!) 351 lb 12.8 oz (159.6 kg)    Fetal Status: Fetal Heart Rate (bpm): 140   Movement: Present     General:  Alert, oriented and cooperative. Patient is in no acute distress.  Skin: Skin is warm and dry. No rash noted.   Cardiovascular: Normal heart rate noted  Respiratory: Normal respiratory effort, no problems with respiration noted  Abdomen: Soft, gravid, appropriate for gestational age.  Pain/Pressure: Present     Pelvic: Cervical exam deferred        Extremities: Normal range of motion.  Edema: Trace  Mental Status: Normal mood and affect. Normal behavior. Normal judgment and thought content.   Assessment and Plan:  Pregnancy: G2P1001 at [redacted]w[redacted]d 1. Supervision of other normal pregnancy, antepartum - Doing well, vigorous and frequent fetal movement  2. [redacted] weeks gestation of pregnancy - 3rd Tri labs WDL - Continue routine visits  3. BMI 50.0-59.9, adult (HCC) 4. Multigravida of advanced maternal age in second trimester - Has a repeat growth U/S scheduled - Current plan is for weekly BPP starting at 34 weeks until delivery - Baby ASA  -  Normal genetic screening  5. History of cesarean delivery - Plan for repeat, Consented on 05/26/22   Preterm labor symptoms and general obstetric precautions including but not limited to vaginal bleeding, contractions, leaking of fluid and fetal movement were reviewed in detail with the patient. Please refer to After Visit Summary for other counseling recommendations.   No follow-ups on file.  Future Appointments  Date Time Provider Department Center  07/07/2022  1:30 PM Corlis Hove, NP [facility] None  07/09/2022  2:30 PM [facility] NURSE [facility]  07/09/2022  2:45 PM [facility] US5 [facility]  07/16/2022  1:15 PM [facility] NURSE [facility]  07/16/2022  1:30 PM [facility] US2 [facility]  07/19/2022 12:30 PM [facility] NURSE [facility]  07/19/2022 12:45 PM [facility] US5 [facility]  07/21/2022  1:30 PM Adam Phenix, MD [facility] None  07/28/2022  1:30 PM Lennart Pall, MD [facility] None  07/28/2022  2:45 PM [facility] NURSE [facility]  07/28/2022  3:00 PM [facility] US1 [facility]  08/03/2022 12:30 PM [facility] NURSE [facility]  08/03/2022 12:45 PM [facility]5 [facility]    Corlis Hove, NP

## 2022-07-09 ENCOUNTER — Ambulatory Visit: Payer: BC Managed Care – PPO | Admitting: *Deleted

## 2022-07-09 ENCOUNTER — Ambulatory Visit: Payer: BC Managed Care – PPO | Attending: Obstetrics and Gynecology

## 2022-07-09 VITALS — BP 140/79 | HR 99

## 2022-07-09 DIAGNOSIS — O09523 Supervision of elderly multigravida, third trimester: Secondary | ICD-10-CM | POA: Insufficient documentation

## 2022-07-09 DIAGNOSIS — O34219 Maternal care for unspecified type scar from previous cesarean delivery: Secondary | ICD-10-CM | POA: Diagnosis not present

## 2022-07-09 DIAGNOSIS — Z3480 Encounter for supervision of other normal pregnancy, unspecified trimester: Secondary | ICD-10-CM

## 2022-07-09 DIAGNOSIS — R638 Other symptoms and signs concerning food and fluid intake: Secondary | ICD-10-CM

## 2022-07-09 DIAGNOSIS — O09522 Supervision of elderly multigravida, second trimester: Secondary | ICD-10-CM

## 2022-07-09 DIAGNOSIS — E669 Obesity, unspecified: Secondary | ICD-10-CM | POA: Diagnosis not present

## 2022-07-09 DIAGNOSIS — O99213 Obesity complicating pregnancy, third trimester: Secondary | ICD-10-CM | POA: Insufficient documentation

## 2022-07-09 DIAGNOSIS — O99212 Obesity complicating pregnancy, second trimester: Secondary | ICD-10-CM

## 2022-07-09 DIAGNOSIS — Z3A34 34 weeks gestation of pregnancy: Secondary | ICD-10-CM | POA: Insufficient documentation

## 2022-07-16 ENCOUNTER — Ambulatory Visit: Payer: BC Managed Care – PPO | Attending: Obstetrics and Gynecology

## 2022-07-16 ENCOUNTER — Ambulatory Visit: Payer: BC Managed Care – PPO | Admitting: *Deleted

## 2022-07-16 VITALS — BP 130/59 | HR 90

## 2022-07-16 DIAGNOSIS — O99213 Obesity complicating pregnancy, third trimester: Secondary | ICD-10-CM | POA: Diagnosis not present

## 2022-07-16 DIAGNOSIS — O99212 Obesity complicating pregnancy, second trimester: Secondary | ICD-10-CM

## 2022-07-16 DIAGNOSIS — Z3480 Encounter for supervision of other normal pregnancy, unspecified trimester: Secondary | ICD-10-CM

## 2022-07-16 DIAGNOSIS — O34219 Maternal care for unspecified type scar from previous cesarean delivery: Secondary | ICD-10-CM | POA: Diagnosis not present

## 2022-07-16 DIAGNOSIS — Z3A35 35 weeks gestation of pregnancy: Secondary | ICD-10-CM | POA: Diagnosis not present

## 2022-07-16 DIAGNOSIS — E669 Obesity, unspecified: Secondary | ICD-10-CM

## 2022-07-16 DIAGNOSIS — O09523 Supervision of elderly multigravida, third trimester: Secondary | ICD-10-CM | POA: Insufficient documentation

## 2022-07-16 DIAGNOSIS — R638 Other symptoms and signs concerning food and fluid intake: Secondary | ICD-10-CM

## 2022-07-16 DIAGNOSIS — O09522 Supervision of elderly multigravida, second trimester: Secondary | ICD-10-CM

## 2022-07-19 ENCOUNTER — Ambulatory Visit: Payer: BC Managed Care – PPO | Admitting: *Deleted

## 2022-07-19 ENCOUNTER — Ambulatory Visit: Payer: BC Managed Care – PPO | Attending: Obstetrics and Gynecology

## 2022-07-19 VITALS — BP 137/84 | HR 91

## 2022-07-19 DIAGNOSIS — Z3A35 35 weeks gestation of pregnancy: Secondary | ICD-10-CM

## 2022-07-19 DIAGNOSIS — O09523 Supervision of elderly multigravida, third trimester: Secondary | ICD-10-CM | POA: Insufficient documentation

## 2022-07-19 DIAGNOSIS — O99213 Obesity complicating pregnancy, third trimester: Secondary | ICD-10-CM | POA: Insufficient documentation

## 2022-07-19 DIAGNOSIS — O34219 Maternal care for unspecified type scar from previous cesarean delivery: Secondary | ICD-10-CM | POA: Insufficient documentation

## 2022-07-21 ENCOUNTER — Ambulatory Visit (INDEPENDENT_AMBULATORY_CARE_PROVIDER_SITE_OTHER): Payer: BC Managed Care – PPO | Admitting: Student

## 2022-07-21 ENCOUNTER — Other Ambulatory Visit (HOSPITAL_COMMUNITY)
Admission: RE | Admit: 2022-07-21 | Discharge: 2022-07-21 | Disposition: A | Discharge status: Home / Self Care | Payer: BC Managed Care – PPO | Source: Ambulatory Visit | Attending: Obstetrics & Gynecology | Admitting: Obstetrics & Gynecology

## 2022-07-21 VITALS — BP 135/81 | HR 91 | Wt 352.0 lb

## 2022-07-21 DIAGNOSIS — Z3A36 36 weeks gestation of pregnancy: Secondary | ICD-10-CM

## 2022-07-21 DIAGNOSIS — Z3480 Encounter for supervision of other normal pregnancy, unspecified trimester: Secondary | ICD-10-CM

## 2022-07-21 DIAGNOSIS — Z3483 Encounter for supervision of other normal pregnancy, third trimester: Secondary | ICD-10-CM

## 2022-07-21 DIAGNOSIS — Z3A00 Weeks of gestation of pregnancy not specified: Secondary | ICD-10-CM | POA: Insufficient documentation

## 2022-07-21 NOTE — Progress Notes (Signed)
ROB GBS 

## 2022-07-21 NOTE — Progress Notes (Signed)
Patient presents for ROB visit. No concerns at this time.   

## 2022-07-21 NOTE — Progress Notes (Signed)
   PRENATAL VISIT NOTE  Subjective:  Brooke Bryant is a 36 y.o. G2P1001 at [redacted]w[redacted]d being seen today for ongoing prenatal care.  She is currently monitored for the following issues for this high-risk pregnancy and has Supervision of other normal pregnancy, antepartum; BMI 50.0-59.9, adult (HCC); Obesity complicating pregnancy; Advanced maternal age in multigravida; and History of cesarean delivery on their problem list.  Patient reports no complaints.  Contractions: Irritability. Vag. Bleeding: None.  Movement: Present. Denies leaking of fluid.   The following portions of the patient's history were reviewed and updated as appropriate: allergies, current medications, past family history, past medical history, past social history, past surgical history and problem list.   Objective:   Vitals:   07/21/22 1319  BP: 135/81  Pulse: 91  Weight: (!) 352 lb (159.7 kg)    Fetal Status: Fetal Heart Rate (bpm): 133   Movement: Present     General:  Alert, oriented and cooperative. Patient is in no acute distress.  Skin: Skin is warm and dry. No rash noted.   Cardiovascular: Normal heart rate noted  Respiratory: Normal respiratory effort, no problems with respiration noted  Abdomen: Soft, gravid, appropriate for gestational age.  Pain/Pressure: Present     Pelvic: Cervical exam deferred        Extremities: Normal range of motion.  Edema: None  Mental Status: Normal mood and affect. Normal behavior. Normal judgment and thought content.   Assessment and Plan:  Pregnancy: G2P1001 at [redacted]w[redacted]d 1. Supervision of other normal pregnancy, antepartum - Vigorous fetal movement  - Request to schedule c-section submitted - Culture, beta strep (group b only) - Cervicovaginal ancillary only( [place])  2. [redacted] weeks gestation of pregnancy   Preterm labor symptoms and general obstetric precautions including but not limited to vaginal bleeding, contractions, leaking of fluid and fetal movement were  reviewed in detail with the patient. Please refer to After Visit Summary for other counseling recommendations.   No follow-ups on file.  Future Appointments  Date Time Provider Department Center  07/28/2022  1:30 PM Lennart Pall, MD [facility] None  07/28/2022  2:45 PM [facility] NURSE [facility]  07/28/2022  3:00 PM [facility] US1 [facility]  08/03/2022 12:30 PM [facility]URSE [facility]  08/03/2022 12:45 PM [facility] US5 [facility]    Corlis Hove, NP

## 2022-07-22 LAB — CERVICOVAGINAL ANCILLARY ONLY
Chlamydia: NEGATIVE
Comment: NEGATIVE
Comment: NEGATIVE
Comment: NORMAL
Neisseria Gonorrhea: NEGATIVE
Trichomonas: NEGATIVE

## 2022-07-24 LAB — CULTURE, BETA STREP (GROUP B ONLY): Strep Gp B Culture: POSITIVE — AB

## 2022-07-28 ENCOUNTER — Ambulatory Visit: Payer: BC Managed Care – PPO | Attending: Obstetrics and Gynecology

## 2022-07-28 ENCOUNTER — Ambulatory Visit: Payer: BC Managed Care – PPO | Admitting: *Deleted

## 2022-07-28 ENCOUNTER — Ambulatory Visit (INDEPENDENT_AMBULATORY_CARE_PROVIDER_SITE_OTHER): Payer: BC Managed Care – PPO | Admitting: Obstetrics and Gynecology

## 2022-07-28 VITALS — BP 144/77 | HR 89

## 2022-07-28 VITALS — BP 126/82 | HR 86 | Wt 352.0 lb

## 2022-07-28 DIAGNOSIS — Z3480 Encounter for supervision of other normal pregnancy, unspecified trimester: Secondary | ICD-10-CM

## 2022-07-28 DIAGNOSIS — Z3A37 37 weeks gestation of pregnancy: Secondary | ICD-10-CM | POA: Diagnosis not present

## 2022-07-28 DIAGNOSIS — Z6841 Body Mass Index (BMI) 40.0 and over, adult: Secondary | ICD-10-CM

## 2022-07-28 DIAGNOSIS — E669 Obesity, unspecified: Secondary | ICD-10-CM | POA: Diagnosis not present

## 2022-07-28 DIAGNOSIS — Z98891 History of uterine scar from previous surgery: Secondary | ICD-10-CM

## 2022-07-28 DIAGNOSIS — O09523 Supervision of elderly multigravida, third trimester: Secondary | ICD-10-CM

## 2022-07-28 DIAGNOSIS — O34219 Maternal care for unspecified type scar from previous cesarean delivery: Secondary | ICD-10-CM | POA: Diagnosis not present

## 2022-07-28 DIAGNOSIS — O99213 Obesity complicating pregnancy, third trimester: Secondary | ICD-10-CM | POA: Diagnosis not present

## 2022-07-28 NOTE — Progress Notes (Signed)
   PRENATAL VISIT NOTE  Subjective:  Brooke Bryant is a 36 y.o. G2P1001 at [redacted]w[redacted]d being seen today for ongoing prenatal care.  She is currently monitored for the following issues for this low-risk pregnancy and has Supervision of other normal pregnancy, antepartum; BMI 50.0-59.9, adult (HCC); Obesity complicating pregnancy; Advanced maternal age in multigravida; and History of cesarean delivery on their problem list.  Patient reports she is doing well.  Contractions: Not present. Vag. Bleeding: None.  Movement: Present. Denies leaking of fluid.   The following portions of the patient's history were reviewed and updated as appropriate: allergies, current medications, past family history, past medical history, past social history, past surgical history and problem list.   Objective:   Vitals:   07/28/22 1319  BP: 126/82  Pulse: 86  Weight: (!) 352 lb (159.7 kg)   Fetal Status: Fetal Heart Rate (bpm): 125   Movement: Present     General:  Alert, oriented and cooperative. Patient is in no acute distress.  Skin: Skin is warm and dry. No rash noted.   Cardiovascular: Normal heart rate noted  Respiratory: Normal respiratory effort, no problems with respiration noted  Abdomen: Soft, gravid, appropriate for gestational age.  Pain/Pressure: Present      Assessment and Plan:  Pregnancy: G2P1001 at [redacted]w[redacted]d 1. Supervision of other normal pregnancy, antepartum GBS positive Repeat CS ordered for 39 weeks  2. Multigravida of advanced maternal age in third trimester BMI 50.0-59.9, adult (HCC) Continue weekly BPPs, next growth scheduled for 1/2  3. History of cesarean delivery Repeat CS ordered today  Term labor symptoms and general obstetric precautions including but not limited to vaginal bleeding, contractions, leaking of fluid and fetal movement were reviewed in detail with the patient. Please refer to After Visit Summary for other counseling recommendations.   Return in about 1 week  (around 08/04/2022).  Future Appointments  Date Time Provider Department Center  07/28/2022  2:45 PM [facility] NURSE [facility]  07/28/2022  3:00 PM [facility] US1 [facility]  08/03/2022 12:30 PM [facility]URSE [facility]  08/03/2022 12:45 PM [facility] US5 [facility]    Lennart Pall, MD

## 2022-07-28 NOTE — Progress Notes (Signed)
ROB 37 wks GBS +, collected last visit GC/CC negative Request SVE today  Request FMLA starting 08/04/22-12 wks. Obtaining paperwork from employer.

## 2022-08-03 ENCOUNTER — Inpatient Hospital Stay (HOSPITAL_COMMUNITY): Payer: BC Managed Care – PPO | Admitting: Anesthesiology

## 2022-08-03 ENCOUNTER — Encounter (HOSPITAL_COMMUNITY)
Admission: AD | Disposition: A | Discharge status: Home / Self Care | Payer: Self-pay | Source: Home / Self Care | Attending: Obstetrics and Gynecology

## 2022-08-03 ENCOUNTER — Ambulatory Visit: Payer: BC Managed Care – PPO | Admitting: *Deleted

## 2022-08-03 ENCOUNTER — Ambulatory Visit (HOSPITAL_BASED_OUTPATIENT_CLINIC_OR_DEPARTMENT_OTHER): Payer: BC Managed Care – PPO

## 2022-08-03 ENCOUNTER — Encounter (HOSPITAL_COMMUNITY): Payer: Self-pay | Admitting: Obstetrics and Gynecology

## 2022-08-03 ENCOUNTER — Encounter: Payer: Self-pay | Admitting: *Deleted

## 2022-08-03 ENCOUNTER — Other Ambulatory Visit: Payer: Self-pay

## 2022-08-03 ENCOUNTER — Ambulatory Visit (INDEPENDENT_AMBULATORY_CARE_PROVIDER_SITE_OTHER): Payer: BC Managed Care – PPO | Admitting: Obstetrics

## 2022-08-03 ENCOUNTER — Inpatient Hospital Stay (HOSPITAL_COMMUNITY)
Admission: AD | Admit: 2022-08-03 | Discharge: 2022-08-05 | DRG: 785 | Disposition: A | Discharge status: Home / Self Care | Payer: BC Managed Care – PPO | Attending: Obstetrics and Gynecology | Admitting: Obstetrics and Gynecology

## 2022-08-03 ENCOUNTER — Encounter: Payer: Self-pay | Admitting: Obstetrics

## 2022-08-03 VITALS — BP 150/91 | HR 96 | Wt 358.0 lb

## 2022-08-03 VITALS — BP 125/62 | HR 82

## 2022-08-03 DIAGNOSIS — Z3A37 37 weeks gestation of pregnancy: Secondary | ICD-10-CM

## 2022-08-03 DIAGNOSIS — O99820 Streptococcus B carrier state complicating pregnancy: Secondary | ICD-10-CM

## 2022-08-03 DIAGNOSIS — O0993 Supervision of high risk pregnancy, unspecified, third trimester: Secondary | ICD-10-CM

## 2022-08-03 DIAGNOSIS — O139 Gestational [pregnancy-induced] hypertension without significant proteinuria, unspecified trimester: Secondary | ICD-10-CM

## 2022-08-03 DIAGNOSIS — Z362 Encounter for other antenatal screening follow-up: Secondary | ICD-10-CM | POA: Diagnosis not present

## 2022-08-03 DIAGNOSIS — Z3480 Encounter for supervision of other normal pregnancy, unspecified trimester: Secondary | ICD-10-CM | POA: Insufficient documentation

## 2022-08-03 DIAGNOSIS — O99213 Obesity complicating pregnancy, third trimester: Secondary | ICD-10-CM

## 2022-08-03 DIAGNOSIS — O99214 Obesity complicating childbirth: Secondary | ICD-10-CM | POA: Diagnosis present

## 2022-08-03 DIAGNOSIS — O34219 Maternal care for unspecified type scar from previous cesarean delivery: Secondary | ICD-10-CM

## 2022-08-03 DIAGNOSIS — Z98891 History of uterine scar from previous surgery: Secondary | ICD-10-CM

## 2022-08-03 DIAGNOSIS — O34211 Maternal care for low transverse scar from previous cesarean delivery: Secondary | ICD-10-CM | POA: Diagnosis present

## 2022-08-03 DIAGNOSIS — O99210 Obesity complicating pregnancy, unspecified trimester: Secondary | ICD-10-CM

## 2022-08-03 DIAGNOSIS — Z87891 Personal history of nicotine dependence: Secondary | ICD-10-CM | POA: Diagnosis not present

## 2022-08-03 DIAGNOSIS — R030 Elevated blood-pressure reading, without diagnosis of hypertension: Secondary | ICD-10-CM | POA: Diagnosis not present

## 2022-08-03 DIAGNOSIS — O09523 Supervision of elderly multigravida, third trimester: Secondary | ICD-10-CM | POA: Diagnosis not present

## 2022-08-03 DIAGNOSIS — O133 Gestational [pregnancy-induced] hypertension without significant proteinuria, third trimester: Secondary | ICD-10-CM

## 2022-08-03 DIAGNOSIS — Z302 Encounter for sterilization: Secondary | ICD-10-CM | POA: Diagnosis not present

## 2022-08-03 DIAGNOSIS — Z7982 Long term (current) use of aspirin: Secondary | ICD-10-CM

## 2022-08-03 DIAGNOSIS — O09529 Supervision of elderly multigravida, unspecified trimester: Secondary | ICD-10-CM

## 2022-08-03 DIAGNOSIS — Z9079 Acquired absence of other genital organ(s): Secondary | ICD-10-CM

## 2022-08-03 DIAGNOSIS — O99824 Streptococcus B carrier state complicating childbirth: Secondary | ICD-10-CM | POA: Diagnosis not present

## 2022-08-03 DIAGNOSIS — O134 Gestational [pregnancy-induced] hypertension without significant proteinuria, complicating childbirth: Principal | ICD-10-CM | POA: Diagnosis present

## 2022-08-03 DIAGNOSIS — O0990 Supervision of high risk pregnancy, unspecified, unspecified trimester: Secondary | ICD-10-CM

## 2022-08-03 HISTORY — PX: TUBAL LIGATION: SHX77

## 2022-08-03 HISTORY — DX: Gestational (pregnancy-induced) hypertension without significant proteinuria, unspecified trimester: O13.9

## 2022-08-03 LAB — PROTEIN / CREATININE RATIO, URINE
Creatinine, Urine: 180 mg/dL
Protein Creatinine Ratio: 0.11 mg/mg{Cre} (ref 0.00–0.15)
Total Protein, Urine: 19 mg/dL

## 2022-08-03 LAB — CBC
HCT: 33.3 % — ABNORMAL LOW (ref 36.0–46.0)
Hemoglobin: 11.3 g/dL — ABNORMAL LOW (ref 12.0–15.0)
MCH: 28.6 pg (ref 26.0–34.0)
MCHC: 33.9 g/dL (ref 30.0–36.0)
MCV: 84.3 fL (ref 80.0–100.0)
Platelets: 295 10*3/uL (ref 150–400)
RBC: 3.95 MIL/uL (ref 3.87–5.11)
RDW: 13.2 % (ref 11.5–15.5)
WBC: 12 10*3/uL — ABNORMAL HIGH (ref 4.0–10.5)
nRBC: 0 % (ref 0.0–0.2)

## 2022-08-03 LAB — URINALYSIS, ROUTINE W REFLEX MICROSCOPIC
Bilirubin Urine: NEGATIVE
Glucose, UA: NEGATIVE mg/dL
Hgb urine dipstick: NEGATIVE
Ketones, ur: 80 mg/dL — AB
Leukocytes,Ua: NEGATIVE
Nitrite: NEGATIVE
Protein, ur: NEGATIVE mg/dL
Specific Gravity, Urine: 1.028 (ref 1.005–1.030)
pH: 6 (ref 5.0–8.0)

## 2022-08-03 LAB — COMPREHENSIVE METABOLIC PANEL WITH GFR
ALT: 19 U/L (ref 0–44)
AST: 20 U/L (ref 15–41)
Albumin: 2.7 g/dL — ABNORMAL LOW (ref 3.5–5.0)
Alkaline Phosphatase: 132 U/L — ABNORMAL HIGH (ref 38–126)
Anion gap: 8 (ref 5–15)
BUN: 12 mg/dL (ref 6–20)
CO2: 22 mmol/L (ref 22–32)
Calcium: 8.7 mg/dL — ABNORMAL LOW (ref 8.9–10.3)
Chloride: 103 mmol/L (ref 98–111)
Creatinine, Ser: 0.66 mg/dL (ref 0.44–1.00)
GFR, Estimated: >60 mL/min
Glucose, Bld: 118 mg/dL — ABNORMAL HIGH (ref 70–99)
Potassium: 3.6 mmol/L (ref 3.5–5.1)
Sodium: 133 mmol/L — ABNORMAL LOW (ref 135–145)
Total Bilirubin: 0.3 mg/dL (ref 0.3–1.2)
Total Protein: 5.9 g/dL — ABNORMAL LOW (ref 6.5–8.1)

## 2022-08-03 SURGERY — Surgical Case
Anesthesia: Spinal

## 2022-08-03 MED ORDER — FENTANYL CITRATE (PF) 100 MCG/2ML IJ SOLN
INTRAMUSCULAR | Status: AC
  Filled 2022-08-03: qty 2

## 2022-08-03 MED ORDER — DEXAMETHASONE SODIUM PHOSPHATE 10 MG/ML IJ SOLN
INTRAMUSCULAR | Status: DC | PRN
  Administered 2022-08-03: 10 mg via INTRAVENOUS

## 2022-08-03 MED ORDER — MORPHINE SULFATE (PF) 0.5 MG/ML IJ SOLN
INTRAMUSCULAR | Status: DC | PRN
  Administered 2022-08-03: 150 ug via INTRATHECAL

## 2022-08-03 MED ORDER — OXYTOCIN-SODIUM CHLORIDE 30-0.9 UT/500ML-% IV SOLN
INTRAVENOUS | Status: AC
  Filled 2022-08-03: qty 500

## 2022-08-03 MED ORDER — ACETAMINOPHEN 10 MG/ML IV SOLN
INTRAVENOUS | Status: DC | PRN
  Administered 2022-08-03: 1000 mg via INTRAVENOUS

## 2022-08-03 MED ORDER — LACTATED RINGERS IV SOLN: INTRAVENOUS | Status: DC

## 2022-08-03 MED ORDER — MORPHINE SULFATE (PF) 0.5 MG/ML IJ SOLN
INTRAMUSCULAR | Status: AC
  Filled 2022-08-03: qty 10

## 2022-08-03 MED ORDER — LABETALOL HCL 5 MG/ML IV SOLN: 80.0000 mg | INTRAVENOUS | Status: DC | PRN

## 2022-08-03 MED ORDER — LABETALOL HCL 5 MG/ML IV SOLN: 20.0000 mg | INTRAVENOUS | Status: DC | PRN

## 2022-08-03 MED ORDER — LACTATED RINGERS IV SOLN: INTRAVENOUS | Status: DC | PRN

## 2022-08-03 MED ORDER — PHENYLEPHRINE HCL (PRESSORS) 10 MG/ML IV SOLN
INTRAVENOUS | Status: DC | PRN
  Administered 2022-08-03 (×2): 40 ug via INTRAVENOUS

## 2022-08-03 MED ORDER — FENTANYL CITRATE (PF) 100 MCG/2ML IJ SOLN
INTRAMUSCULAR | Status: DC | PRN
  Administered 2022-08-03: 35 ug via INTRATHECAL
  Administered 2022-08-03: 15 ug via INTRATHECAL
  Administered 2022-08-03: 50 ug via INTRATHECAL

## 2022-08-03 MED ORDER — ONDANSETRON HCL 4 MG/2ML IJ SOLN
INTRAMUSCULAR | Status: DC | PRN
  Administered 2022-08-03: 4 mg via INTRAVENOUS

## 2022-08-03 MED ORDER — DEXMEDETOMIDINE HCL IN NACL 80 MCG/20ML IV SOLN
INTRAVENOUS | Status: DC | PRN
  Administered 2022-08-03: 4 ug via BUCCAL

## 2022-08-03 MED ORDER — PHENYLEPHRINE HCL-NACL 20-0.9 MG/250ML-% IV SOLN
INTRAVENOUS | Status: DC | PRN
  Administered 2022-08-03: 60 ug/min via INTRAVENOUS

## 2022-08-03 MED ORDER — CEFAZOLIN IN SODIUM CHLORIDE 3-0.9 GM/100ML-% IV SOLN
3.0000 g | INTRAVENOUS | Status: AC
  Administered 2022-08-03: 3 g via INTRAVENOUS
  Filled 2022-08-03: qty 100

## 2022-08-03 MED ORDER — LABETALOL HCL 5 MG/ML IV SOLN: 40.0000 mg | INTRAVENOUS | Status: DC | PRN

## 2022-08-03 MED ORDER — SOD CITRATE-CITRIC ACID 500-334 MG/5ML PO SOLN
30.0000 mL | ORAL | Status: AC
  Administered 2022-08-03: 30 mL via ORAL
  Filled 2022-08-03: qty 30

## 2022-08-03 MED ORDER — HYDRALAZINE HCL 20 MG/ML IJ SOLN: 10.0000 mg | INTRAMUSCULAR | Status: DC | PRN

## 2022-08-03 MED ORDER — OXYTOCIN-SODIUM CHLORIDE 30-0.9 UT/500ML-% IV SOLN
INTRAVENOUS | Status: DC | PRN
  Administered 2022-08-03: 30 [IU] via INTRAVENOUS

## 2022-08-03 MED ORDER — BUPIVACAINE IN DEXTROSE 0.75-8.25 % IT SOLN
INTRATHECAL | Status: DC | PRN
  Administered 2022-08-03: 1.6 mL via INTRATHECAL

## 2022-08-03 SURGICAL SUPPLY — 33 items
APL PRP STRL LF DISP 70% ISPRP (MISCELLANEOUS) ×4
CHLORAPREP W/TINT 26 (MISCELLANEOUS) ×6 IMPLANT
CLAMP UMBILICAL CORD (MISCELLANEOUS) ×3 IMPLANT
DRESSING PREVENA PLUS CUSTOM (GAUZE/BANDAGES/DRESSINGS) IMPLANT
DRSG OPSITE POSTOP 4X10 (GAUZE/BANDAGES/DRESSINGS) ×3 IMPLANT
DRSG PREVENA PLUS CUSTOM (GAUZE/BANDAGES/DRESSINGS) ×2
ELECT REM PT RETURN 9FT ADLT (ELECTROSURGICAL) ×2
ELECTRODE REM PT RTRN 9FT ADLT (ELECTROSURGICAL) ×3 IMPLANT
EXTRACTOR VACUUM KIWI (MISCELLANEOUS) IMPLANT
EXTRACTOR VACUUM M CUP 4 TUBE (SUCTIONS) IMPLANT
GLOVE BIOGEL PI IND STRL 6.5 (GLOVE) ×3 IMPLANT
GLOVE BIOGEL PI IND STRL 7.0 (GLOVE) ×3 IMPLANT
GLOVE SURG SS PI 6.5 STRL IVOR (GLOVE) ×3 IMPLANT
GOWN STRL REUS W/TWL LRG LVL3 (GOWN DISPOSABLE) ×6 IMPLANT
HEMOSTAT ARISTA ABSORB 3G PWDR (HEMOSTASIS) IMPLANT
KIT ABG SYR 3ML LUER SLIP (SYRINGE) IMPLANT
NDL HYPO 25X5/8 SAFETYGLIDE (NEEDLE) IMPLANT
NEEDLE HYPO 25X5/8 SAFETYGLIDE (NEEDLE) IMPLANT
NS IRRIG 1000ML POUR BTL (IV SOLUTION) ×3 IMPLANT
PACK C SECTION WH (CUSTOM PROCEDURE TRAY) ×3 IMPLANT
PAD OB MATERNITY 4.3X12.25 (PERSONAL CARE ITEMS) ×3 IMPLANT
RETRACTOR TRAXI PANNICULUS (MISCELLANEOUS) IMPLANT
RTRCTR C-SECT PINK 25CM LRG (MISCELLANEOUS) IMPLANT
SET BERKELEY SUCTION TUBING (SUCTIONS) IMPLANT
SUT PLAIN 0 NONE (SUTURE) IMPLANT
SUT PLAIN 2 0 XLH (SUTURE) IMPLANT
SUT VIC AB 0 CT1 36 (SUTURE) ×12 IMPLANT
SUT VIC AB 4-0 KS 27 (SUTURE) ×3 IMPLANT
TOWEL OR 17X24 6PK STRL BLUE (TOWEL DISPOSABLE) ×3 IMPLANT
TRAY FOLEY W/BAG SLVR 14FR LF (SET/KITS/TRAYS/PACK) ×3 IMPLANT
VACUUM CUP M-STYLE MYSTIC II (SUCTIONS) IMPLANT
WATER STERILE IRR 1000ML POUR (IV SOLUTION) ×3 IMPLANT
YANKAUER SUCT BULB TIP NO VENT (SUCTIONS) IMPLANT

## 2022-08-03 NOTE — H&P (Signed)
OBSTETRIC ADMISSION HISTORY AND PHYSICAL  Brooke Bryant is a 37 y.o. female G2P1001 with IUP at [redacted]w[redacted]d by LMP presenting for elevated BP's. She denies headache, vision changes, RUQ/epigastric pain. No contractions, vaginal bleeding, or leaking fluid. Endorses active fetal movement. Patient is a previous c-section for failure to progress and non-reassuring fetal tracing. She desires a repeat c-section. She plans on breast feeding. Her husband is planning to get a vasectomy and she plans to use condoms in the interim.  She received her prenatal care at [facility]: By LMP --->  Estimated Date of Delivery: 08/18/22  Sono:    @[redacted]w[redacted]d , CWD, normal anatomy, transverse presentation, anterior placenta, 586g, 45% EFW  @[redacted]w[redacted]d , CWD, normal anatomy, cephalic presentation, anterior placenta, 3412g, 69% EFW  Prenatal History/Complications:  - gHTN - AMA - Obesity - GBS positive - Previous c-section  Past Medical History: Past Medical History:  Diagnosis Date   Allergy    Arthritis    Left knee   Gestational hypertension 08/03/2022    Past Surgical History: Past Surgical History:  Procedure Laterality Date   CESAREAN SECTION N/A 02/11/2018   Procedure: CESAREAN SECTION;  Surgeon: Florian Buff, MD;  Location: [facility];  Service: Obstetrics;  Laterality: N/A;   GUM SURGERY     KNEE SURGERY Left     Obstetrical History: OB History     Gravida  2   Para  1   Term  1   Preterm  0   AB  0   Living  1      SAB  0   IAB  0   Ectopic  0   Multiple  0   Live Births  1           Social History Social History   Socioeconomic History   Marital status: Married    Spouse name: Not on file   Number of children: 1   Years of education: Not on file   Highest education level: Not on file  Occupational History   Not on file  Tobacco Use   Smoking status: Former    Packs/day: 1.00    Years: 5.00    Total pack years: 5.00    Types: Cigarettes     Start date: 11/10/2001    Quit date: 08/02/2014    Years since quitting: 8.0   Smokeless tobacco: Never  Vaping Use   Vaping Use: Never used  Substance and Sexual Activity   Alcohol use: Not Currently    Alcohol/week: 3.0 standard drinks of alcohol    Types: 3 Shots of liquor per week    Comment: Not since confirmed pregnancy   Drug use: No   Sexual activity: Yes    Partners: Male    Birth control/protection: None  Other Topics Concern   Not on file  Social History Narrative   Married 2018   Son in 2019   Works for [employer] part time   Social Determinants of Radio broadcast assistant Strain: Not on file  Food Insecurity: Not on file  Transportation Needs: Not on file  Physical Activity: Not on file  Stress: Not on file  Social Connections: Not on file    Family History: Family History  Problem Relation Age of Onset   Depression Mother    Varicose Veins Mother    Kidney disease Mother    Heart disease Father    Hypertension Father    Arthritis Maternal Merchant navy officer  Cancer Paternal Aunt        breast cancer     Allergies: No Known Allergies  Medications Prior to Admission  Medication Sig Dispense Refill Last Dose   aspirin 81 MG chewable tablet Chew 1 tablet (81 mg total) by mouth daily. Start at 14-15 weeks 30 tablet 7 08/03/2022   Prenatal MV & Min w/FA-DHA (PRENATAL GUMMIES PO) Take 2 tablets by mouth daily.   08/02/2022   Review of Systems   All systems reviewed and negative except as stated in HPI  Blood pressure (!) 163/86, pulse (!) 106, temperature 98.9 F (37.2 C), temperature source Oral, resp. rate 20, height 5\' 5"  (1.651 m), weight (!) 161.5 kg, last menstrual period 11/11/2021, SpO2 97 %. General appearance: alert, no distress, and morbidly obese Lungs: effort normal Heart: regular rate Abdomen: soft, non-tender; gravid Extremities: no sign of DVT Presentation: unsure Fetal monitoring: 135 bpm, moderate variability, +15x15 accels, no  decels Uterine activity: quiet    Prenatal labs: ABO, Rh: B/Positive/-- (07/06 1529) Antibody: Negative (07/06 1529) Rubella: 5.68 (07/06 1529) RPR: Non Reactive (10/25 0914)  HBsAg: Negative (07/06 1529)  HIV: Non Reactive (10/25 0914)  GBS: Positive/-- (12/20 1522)  2 hr Glucola: 78/122/93 Genetic screening: LR NIPS, Neg AFP Anatomy US: normal  Prenatal Transfer Tool  Maternal Diabetes: No Genetic Screening: Normal Maternal Ultrasounds/Referrals: Normal Fetal Ultrasounds or other Referrals:  None Maternal Substance Abuse:  No Significant Maternal Medications:  None Significant Maternal Lab Results: Group B Strep positive  No results found for this or any previous visit (from the past 24 hour(s)).  Patient Active Problem List   Diagnosis Date Noted   Gestational hypertension 08/03/2022   BMI 50.0-59.9, adult ([place]) 40/98/1191   Obesity complicating pregnancy 47/82/9562   Advanced maternal age in multigravida 02/04/2022   History of cesarean delivery 02/04/2022   Supervision of other normal pregnancy, antepartum 02/01/2022   Assessment/Plan:  Brooke Bryant is a 37 y.o. G2P1001 at [redacted]w[redacted]d here for repeat c-section in the setting of gHTN  #Labor: Patient desires repeat c-section. Dr. Ilda Basset notified of patient arrival. Patient last ate at 1400. Will keep patient NPO. Dr. Ilda Basset to see patient once out of the OR.  #gHTN: BP's consistently mild range in the 140s-150s/70-90s. Patient has had elevated BPs during pregnancy previously. Patient asymptomatic, labs pending. Labetalol protocol ordered prn in the event patient has severe range BPs #FWB: Cat 1 #ID: GBS pos > antibiotics during c-section #MOF: Breast #MOC: Husband is planning vasectomy, will use condoms in the interim #Circ: N/A   [name], [place]  08/03/2022, 4:51 PM

## 2022-08-03 NOTE — Progress Notes (Signed)
Patient presents for ROB. Patient denies headaches or visual changes, or epigastric pain. Patient has no concerns today.

## 2022-08-03 NOTE — Anesthesia Preprocedure Evaluation (Signed)
Anesthesia Evaluation  Patient identified by MRN, date of birth, ID band Patient awake    Reviewed: Allergy & Precautions, NPO status , Patient's Chart, lab work & pertinent test results  Airway Mallampati: II  TM Distance: >3 FB Neck ROM: Full    Dental  (+) Teeth Intact, Dental Advisory Given   Pulmonary asthma , former smoker   Pulmonary exam normal breath sounds clear to auscultation       Cardiovascular Exercise Tolerance: Good hypertension, negative cardio ROS Normal cardiovascular exam Rhythm:Regular Rate:Normal     Neuro/Psych  Headaches  negative psych ROS   GI/Hepatic negative GI ROS, Neg liver ROS,,,  Endo/Other    Morbid obesity  Renal/GU negative Renal ROS     Musculoskeletal  (+) Arthritis ,    Abdominal  (+) + obese  Peds  Hematology negative hematology ROS (+) Blood dyscrasia, anemia Plt 270k   Anesthesia Other Findings Day of surgery medications reviewed with the patient.  Reproductive/Obstetrics (+) Pregnancy                             Anesthesia Physical Anesthesia Plan  ASA: IV and emergent  Anesthesia Plan: Spinal   Post-op Pain Management:    Induction:   PONV Risk Score and Plan: 2 and Treatment may vary due to age or medical condition  Airway Management Planned: Natural Airway  Additional Equipment:   Intra-op Plan:   Post-operative Plan:   Informed Consent: I have reviewed the patients History and Physical, chart, labs and discussed the procedure including the risks, benefits and alternatives for the proposed anesthesia with the patient or authorized representative who has indicated his/her understanding and acceptance.     Dental advisory given  Plan Discussed with:   Anesthesia Plan Comments:         Anesthesia Quick Evaluation

## 2022-08-03 NOTE — Anesthesia Procedure Notes (Signed)
Spinal  Patient location during procedure: OB Start time: 08/03/2022 10:17 PM End time: 08/03/2022 10:22 PM Reason for block: surgical anesthesia Staffing Performed: anesthesiologist  Anesthesiologist: Lynda Rainwater, MD Performed by: Lynda Rainwater, MD Authorized by: Lynda Rainwater, MD   Preanesthetic Checklist Completed: patient identified, IV checked, risks and benefits discussed, surgical consent, monitors and equipment checked, pre-op evaluation and timeout performed Spinal Block Patient position: sitting Prep: DuraPrep and site prepped and draped Patient monitoring: heart rate, cardiac monitor, continuous pulse ox and blood pressure Approach: midline Location: L3-4 Injection technique: single-shot Needle Needle type: Quincke  Needle gauge: 22 G Needle length: 15 cm Assessment Sensory level: T4 Events: CSF return Additional Notes Used 15 cm Quincke

## 2022-08-03 NOTE — Op Note (Signed)
Brooke Bryant PROCEDURE DATE: 08/03/2022  PREOPERATIVE DIAGNOSIS: Intrauterine pregnancy at  [redacted]w[redacted]d weeks gestation with GHTN; patient declines vag del attempt and desire for permanent sterilization  POSTOPERATIVE DIAGNOSIS: The same  PROCEDURE:     Cesarean Section and bilateral salpingectomy  SURGEON:  Dr. Mora Bellman  ASSISTANT: Dr Clement Husbands  An experienced assistant was required given the standard of surgical care given the complexity of the case.  This assistant was needed for exposure, dissection, suctioning, retraction, instrument exchange, assisting with delivery with administration of fundal pressure, and for overall help during the procedure.    INDICATIONS: Brooke Bryant is a 37 y.o. G2P1001 at [redacted]w[redacted]d with GHTN scheduled for cesarean section secondary to patient declines vag del attempt.  The risks of cesarean section discussed with the patient included but were not limited to: bleeding which may require transfusion or reoperation; infection which may require antibiotics; injury to bowel, bladder, ureters or other surrounding organs; injury to the fetus; need for additional procedures including hysterectomy in the event of a life-threatening hemorrhage; placental abnormalities wth subsequent pregnancies, incisional problems, thromboembolic phenomenon and other postoperative/anesthesia complications. The patient concurred with the proposed plan, giving informed written consent for the procedure.  Patient also desires permanent sterilization. Risks and benefits of procedure discussed with patient including permanence of method, bleeding, infection, injury to surrounding organs and need for additional procedures. Risk failure of 0.5-1% with increased risk of ectopic gestation if pregnancy occurs was also discussed with patient.  FINDINGS:  Viable female infant in cephalic presentation.  Apgars 3 and 9, weight, 6 pounds and 2.4 ounces.  Clear amniotic fluid.  Intact  placenta, three vessel cord.  Normal uterus, fallopian tubes and ovaries bilaterally.  ANESTHESIA:    Spinal INTRAVENOUS FLUIDS:1500 ml ESTIMATED BLOOD LOSS: 456 ml URINE OUTPUT:  100 ml SPECIMENS: Placenta sent to L&D and portions of left and right fallopian tubes sent to pathology COMPLICATIONS: None immediate  PROCEDURE IN DETAIL:  The patient received intravenous antibiotics and had sequential compression devices applied to her lower extremities while in the preoperative area.  She was then taken to the operating room where anesthesia was induced and was found to be adequate. A foley catheter was placed into her bladder and attached to Evyn Putzier gravity. She was then placed in a dorsal supine position with a leftward tilt, and prepped and draped in a sterile manner. After an adequate timeout was performed, a Pfannenstiel skin incision was made with scalpel and carried through to the underlying layer of fascia. The fascia was incised in the midline and this incision was extended bilaterally using the Mayo scissors. Kocher clamps were applied to the superior aspect of the fascial incision and the underlying rectus muscles were dissected off bluntly. A similar process was carried out on the inferior aspect of the facial incision. The rectus muscles were separated in the midline bluntly and the peritoneum was entered bluntly. The Alexis self-retaining retractor was introduced into the abdominal cavity. Attention was turned to the lower uterine segment where a transverse hysterotomy was made with a scalpel and extended bilaterally bluntly. The infant was successfully delivered with the assistance of a vacuum with pop off x 2. It was very difficult to feel the fundus to provide adequate fundal pressure. The cord was clamped and cut and infant was handed over to awaiting neonatology team. Uterine massage was then administered and the placenta delivered intact with three-vessel cord. The uterus was cleared of  clot and debris.  The hysterotomy was  closed with 0 Vicryl in a running locked fashion. Overall, excellent hemostasis was noted. The pelvis was cleared of all clot and debris. Hemostasis was confirmed on all surfaces.  The patient's left fallopian tube was then identified, brought to the incision, and grasped with a Babcock clamp. The tube was then followed out to the fimbria. The Babcock clamp was then used to grasp the tube approximately 4 cm from the cornual region. A 3 cm segment of the tube including the fimbriated end was then ligated with free tie of plain gut suture, transected and excised. Good hemostasis was noted and the tube was returned to the abdomen. The right fallopian tube was then identified to its fimbriated end, ligated, and a 3 cm segment which included the fimbriated end was excised in a similar fashion. Excellent hemostasis was noted, and the tube returned to the abdomen.  The peritoneum and the muscles were reapproximated using 0 vicryl interrupted stitches. The fascia was then closed using 0 Vicryl in a running fashion.  The subcutaneous layer was reapproximated with plain gut and the skin was closed in a subcuticular fashion using 3.0 Vicryl. The patient tolerated the procedure well. Sponge, lap, instrument and needle counts were correct x 2. She was taken to the recovery room in stable condition.    Dorinda Stehr ConstantMD  08/03/2022 11:31 PM

## 2022-08-03 NOTE — MAU Note (Signed)
.  Brooke Bryant is a 37 y.o. at [redacted]w[redacted]d here in MAU reporting: sent over from office for elevated BP, states this is a new problem. Denies PIH s/sx. No VB, LOF, +FM. Last NPO- 1400.  Pain score: 0 Vitals:   08/03/22 1549  BP: (!) 155/84  Pulse: 99  Resp: 20  Temp: 98.9 F (37.2 C)  SpO2: 97%     FHT:141 Lab orders placed from triage:  UA

## 2022-08-03 NOTE — Progress Notes (Signed)
Subjective:  Brooke Bryant is a 37 y.o. G2P1001 at [redacted]w[redacted]d being seen today for ongoing prenatal care.  She is currently monitored for the following issues for this high-risk pregnancy and has Supervision of other normal pregnancy, antepartum; BMI 50.0-59.9, adult ([place]); Obesity complicating pregnancy; Advanced maternal age in multigravida; and History of cesarean delivery on their problem list.  Patient reports no complaints.  Contractions: Not present. Vag. Bleeding: None.  Movement: Present. Denies leaking of fluid.   The following portions of the patient's history were reviewed and updated as appropriate: allergies, current medications, past family history, past medical history, past social history, past surgical history and problem list. Problem list updated.  Objective:   Vitals:   08/03/22 1448 08/03/22 1455 08/03/22 1513  BP: (!) 146/89 139/84 (!) 150/91  Pulse: (!) 106 89 96  Weight: (!) 358 lb (162.4 kg)      Fetal Status: Fetal Heart Rate (bpm): 135   Movement: Present     General:  Alert, oriented and cooperative. Patient is in no acute distress.  Skin: Skin is warm and dry. No rash noted.   Cardiovascular: Normal heart rate noted  Respiratory: Normal respiratory effort, no problems with respiration noted  Abdomen: Soft, gravid, appropriate for gestational age. Pain/Pressure: Absent     Pelvic:  Cervical exam deferred        Extremities: Normal range of motion.  Edema: None  Mental Status: Normal mood and affect. Normal behavior. Normal judgment and thought content.   Urinalysis:      Assessment and Plan:  Pregnancy: G2P1001 at [redacted]w[redacted]d  1. Supervision of high risk pregnancy, antepartum  2. AMA (advanced maternal age) multigravida 68+, third trimester - weekly BPP's and growth scans every 4 weeks  3. History of cesarean delivery - desires repeat C/S - repeat C/S scheduled for 39 weeks  4. PIH (pregnancy induced hypertension), third trimester - elevated BP's-  denies HA's or visual changes - patient sent to MAU for further monitoring and PIH labs  5. Obesity affecting pregnancy, antepartum, unspecified obesity type     Term labor symptoms and general obstetric precautions including but not limited to vaginal bleeding, contractions, leaking of fluid and fetal movement were reviewed in detail with the patient. Please refer to After Visit Summary for other counseling recommendations.   Return in about 4 weeks (around 08/31/2022) for postpartum visit.   Shelly Bombard, MD 08/03/2022 08/03/2022

## 2022-08-04 ENCOUNTER — Encounter (HOSPITAL_COMMUNITY): Payer: Self-pay | Admitting: Obstetrics and Gynecology

## 2022-08-04 DIAGNOSIS — Z9079 Acquired absence of other genital organ(s): Secondary | ICD-10-CM

## 2022-08-04 LAB — CBC
HCT: 34.4 % — ABNORMAL LOW (ref 36.0–46.0)
Hemoglobin: 11.9 g/dL — ABNORMAL LOW (ref 12.0–15.0)
MCH: 29.5 pg (ref 26.0–34.0)
MCHC: 34.6 g/dL (ref 30.0–36.0)
MCV: 85.1 fL (ref 80.0–100.0)
Platelets: 295 10*3/uL (ref 150–400)
RBC: 4.04 MIL/uL (ref 3.87–5.11)
RDW: 13.2 % (ref 11.5–15.5)
WBC: 14.9 10*3/uL — ABNORMAL HIGH (ref 4.0–10.5)
nRBC: 0 % (ref 0.0–0.2)

## 2022-08-04 LAB — TYPE AND SCREEN
ABO/RH(D): B POS
Antibody Screen: NEGATIVE

## 2022-08-04 LAB — RPR: RPR Ser Ql: NONREACTIVE

## 2022-08-04 MED ORDER — NALOXONE HCL 0.4 MG/ML IJ SOLN: 0.4000 mg | INTRAMUSCULAR | Status: DC | PRN

## 2022-08-04 MED ORDER — NALOXONE HCL 4 MG/10ML IJ SOLN: 1.0000 ug/kg/h | INTRAVENOUS | Status: DC | PRN

## 2022-08-04 MED ORDER — COCONUT OIL OIL: 1.0000 | TOPICAL_OIL | Status: DC | PRN

## 2022-08-04 MED ORDER — OXYCODONE HCL 5 MG/5ML PO SOLN: 5.0000 mg | Freq: Once | ORAL | Status: DC | PRN

## 2022-08-04 MED ORDER — ENOXAPARIN SODIUM 80 MG/0.8ML IJ SOSY
80.0000 mg | PREFILLED_SYRINGE | INTRAMUSCULAR | Status: DC
  Administered 2022-08-04: 80 mg via SUBCUTANEOUS
  Filled 2022-08-04: qty 0.8

## 2022-08-04 MED ORDER — WITCH HAZEL-GLYCERIN EX PADS: 1.0000 | MEDICATED_PAD | CUTANEOUS | Status: DC | PRN

## 2022-08-04 MED ORDER — MENTHOL 3 MG MT LOZG: 1.0000 | LOZENGE | OROMUCOSAL | Status: DC | PRN

## 2022-08-04 MED ORDER — KETOROLAC TROMETHAMINE 30 MG/ML IJ SOLN
30.0000 mg | Freq: Four times a day (QID) | INTRAMUSCULAR | Status: AC
  Administered 2022-08-04 – 2022-08-05 (×4): 30 mg via INTRAVENOUS
  Filled 2022-08-04 (×4): qty 1

## 2022-08-04 MED ORDER — SODIUM CHLORIDE 0.9% FLUSH: 3.0000 mL | INTRAVENOUS | Status: DC | PRN

## 2022-08-04 MED ORDER — PROMETHAZINE HCL 25 MG/ML IJ SOLN: 6.2500 mg | INTRAMUSCULAR | Status: DC | PRN

## 2022-08-04 MED ORDER — LACTATED RINGERS IV SOLN
INTRAVENOUS | Status: DC
  Administered 2022-08-04: 1000 mL via INTRAVENOUS

## 2022-08-04 MED ORDER — OXYCODONE HCL 5 MG PO TABS: 5.0000 mg | ORAL_TABLET | Freq: Once | ORAL | Status: DC | PRN

## 2022-08-04 MED ORDER — HYDROMORPHONE HCL 1 MG/ML IJ SOLN: 0.2500 mg | INTRAMUSCULAR | Status: DC | PRN

## 2022-08-04 MED ORDER — KETOROLAC TROMETHAMINE 30 MG/ML IJ SOLN
30.0000 mg | Freq: Once | INTRAMUSCULAR | Status: AC | PRN
  Administered 2022-08-04: 30 mg via INTRAVENOUS

## 2022-08-04 MED ORDER — SENNOSIDES-DOCUSATE SODIUM 8.6-50 MG PO TABS
2.0000 | ORAL_TABLET | Freq: Every day | ORAL | Status: DC
  Administered 2022-08-05: 2 via ORAL
  Filled 2022-08-04: qty 2

## 2022-08-04 MED ORDER — ENOXAPARIN SODIUM 40 MG/0.4ML IJ SOSY: 40.0000 mg | PREFILLED_SYRINGE | INTRAMUSCULAR | Status: DC

## 2022-08-04 MED ORDER — OXYTOCIN-SODIUM CHLORIDE 30-0.9 UT/500ML-% IV SOLN
2.5000 [IU]/h | INTRAVENOUS | Status: AC
  Administered 2022-08-04: 2.5 [IU]/h via INTRAVENOUS

## 2022-08-04 MED ORDER — GABAPENTIN 100 MG PO CAPS
200.0000 mg | ORAL_CAPSULE | Freq: Every day | ORAL | Status: DC
  Administered 2022-08-04: 200 mg via ORAL
  Filled 2022-08-04: qty 2

## 2022-08-04 MED ORDER — HYDROMORPHONE HCL 1 MG/ML IJ SOLN
INTRAMUSCULAR | Status: AC
  Filled 2022-08-04: qty 0.5

## 2022-08-04 MED ORDER — MEPERIDINE HCL 25 MG/ML IJ SOLN: 6.2500 mg | INTRAMUSCULAR | Status: DC | PRN

## 2022-08-04 MED ORDER — DIPHENHYDRAMINE HCL 50 MG/ML IJ SOLN: 12.5000 mg | INTRAMUSCULAR | Status: DC | PRN

## 2022-08-04 MED ORDER — IBUPROFEN 600 MG PO TABS
600.0000 mg | ORAL_TABLET | Freq: Four times a day (QID) | ORAL | Status: DC
  Administered 2022-08-05 (×2): 600 mg via ORAL
  Filled 2022-08-04 (×2): qty 1

## 2022-08-04 MED ORDER — PRENATAL MULTIVITAMIN CH
1.0000 | ORAL_TABLET | Freq: Every day | ORAL | Status: DC
  Administered 2022-08-04 – 2022-08-05 (×2): 1 via ORAL
  Filled 2022-08-04 (×2): qty 1

## 2022-08-04 MED ORDER — KETOROLAC TROMETHAMINE 30 MG/ML IJ SOLN
INTRAMUSCULAR | Status: AC
  Filled 2022-08-04: qty 1

## 2022-08-04 MED ORDER — ACETAMINOPHEN 500 MG PO TABS
1000.0000 mg | ORAL_TABLET | Freq: Four times a day (QID) | ORAL | Status: DC
  Administered 2022-08-04 – 2022-08-05 (×5): 1000 mg via ORAL
  Filled 2022-08-04 (×6): qty 2

## 2022-08-04 MED ORDER — DIPHENHYDRAMINE HCL 25 MG PO CAPS: 25.0000 mg | ORAL_CAPSULE | Freq: Four times a day (QID) | ORAL | Status: DC | PRN

## 2022-08-04 MED ORDER — SIMETHICONE 80 MG PO CHEW
80.0000 mg | CHEWABLE_TABLET | Freq: Three times a day (TID) | ORAL | Status: DC
  Administered 2022-08-04 – 2022-08-05 (×3): 80 mg via ORAL
  Filled 2022-08-04 (×3): qty 1

## 2022-08-04 MED ORDER — SIMETHICONE 80 MG PO CHEW: 80.0000 mg | CHEWABLE_TABLET | ORAL | Status: DC | PRN

## 2022-08-04 MED ORDER — DIPHENHYDRAMINE HCL 25 MG PO CAPS: 25.0000 mg | ORAL_CAPSULE | ORAL | Status: DC | PRN

## 2022-08-04 MED ORDER — ZOLPIDEM TARTRATE 5 MG PO TABS: 5.0000 mg | ORAL_TABLET | Freq: Every evening | ORAL | Status: DC | PRN

## 2022-08-04 MED ORDER — MAGNESIUM HYDROXIDE 400 MG/5ML PO SUSP: 30.0000 mL | ORAL | Status: DC | PRN

## 2022-08-04 MED ORDER — DIBUCAINE (PERIANAL) 1 % EX OINT: 1.0000 | TOPICAL_OINTMENT | CUTANEOUS | Status: DC | PRN

## 2022-08-04 MED ORDER — OXYCODONE HCL 5 MG PO TABS: 5.0000 mg | ORAL_TABLET | ORAL | Status: DC | PRN

## 2022-08-04 NOTE — Progress Notes (Signed)
Subjective: Postpartum Day 1: Cesarean Delivery Patient reports tolerating PO.    Objective: Vital signs in last 24 hours: Temp:  [97.6 F (36.4 C)-98.9 F (37.2 C)] 97.6 F (36.4 C) (01/03 0430) Pulse Rate:  [67-109] 70 (01/03 0430) Resp:  [11-27] 18 (01/03 0430) BP: (101-163)/(49-93) 107/52 (01/03 0430) SpO2:  [97 %-100 %] 98 % (01/03 0125) Weight:  [161.5 kg-162.4 kg] 161.5 kg (01/02 1548)  Physical Exam:  General: alert, cooperative, and no distress Lochia: appropriate Uterine Fundus: firm Incision: no significant drainage DVT Evaluation: No evidence of DVT seen on physical exam.  Recent Labs    08/03/22 1624 08/04/22 0541  HGB 11.3* 11.9*  HCT 33.3* 34.4*    Assessment/Plan: Status post Cesarean section. Doing well postoperatively.  Continue current care IV and Foley out  Out of bed today.  Hansel Feinstein, CNM 08/04/2022, 7:39 AM

## 2022-08-04 NOTE — Anesthesia Postprocedure Evaluation (Signed)
Anesthesia Post Note  Patient: Brooke Bryant  Procedure(s) Performed: CESAREAN SECTION BILATERAL TUBAL LIGATION (Bilateral)     Patient location during evaluation: PACU Anesthesia Type: Spinal Level of consciousness: awake and alert Pain management: pain level controlled Vital Signs Assessment: post-procedure vital signs reviewed and stable Respiratory status: spontaneous breathing, nonlabored ventilation and respiratory function stable Cardiovascular status: blood pressure returned to baseline and stable Postop Assessment: no apparent nausea or vomiting Anesthetic complications: no   No notable events documented.  Last Vitals:  Vitals:   08/04/22 0015 08/04/22 0030  BP:  (!) 117/59  Pulse: 77 77  Resp: 17 16  Temp:  36.5 C  SpO2: 99% 100%    Last Pain:  Vitals:   08/04/22 0015  TempSrc:   PainSc: 7    Pain Goal:    LLE Motor Response: Purposeful movement (08/04/22 0030) LLE Sensation: Tingling (08/04/22 0030) RLE Motor Response: Purposeful movement (08/04/22 0030) RLE Sensation: Tingling (08/04/22 0030)     Epidural/Spinal Function Cutaneous sensation: Tingles (08/04/22 0030), Patient able to flex knees: Yes (08/04/22 0030), Patient able to lift hips off bed: Yes (08/04/22 0030), Back pain beyond tenderness at insertion site: No (08/04/22 0030), Progressively worsening motor and/or sensory loss: No (08/04/22 0030), Bowel and/or bladder incontinence post epidural: No (08/04/22 0030)  Lynda Rainwater

## 2022-08-04 NOTE — Lactation Note (Addendum)
This note was copied from a baby's chart. Lactation Consultation Note  Patient Name: Brooke Bryant Date: 08/04/2022 Reason for consult: Initial assessment;Early term 37-38.6wks Amelia Jo, AMA and 2nd C/S delivery see Birth Parent- MR) Age:36 hours P2, ETI female infant, Birth Parent current feeding choice is breastfeeding and supplementing with donor breast milk. Birth Parent latched infant on her right breast using the football hold position, infant was on and off the breast , feeding for 12 minutes. Afterwards Birth Parent supplemented infant with donor breast milk 10 mls, Birth Parent was set up with DEBP and was pumping when LC left the room, Birth Parent will continue to use DEBP every 3 hours for 15 minutes on initial setting. Birth Parent was given breastfeeding supplemental sheet with BF for infant's age and hours of life. Birth Parent will continue to BF infant according to hunger cues, on demand, STS and will ask RN/LC for further latch assistance if needed. LC discussed infant's I&O, maternal rest, diet and hydration for Birth Parent. Mom made aware of O/P services, breastfeeding support groups, community resources, and our phone # for post-discharge questions.   Maternal Data Has patient been taught Hand Expression?: Yes Does the patient have breastfeeding experience prior to this delivery?: Yes How long did the patient breastfeed?: Per Birth Parent, briefly BF 1st child only 3 week and had low milk supply.  Feeding Mother's Current Feeding Choice: Breast Milk and Donor Milk  LATCH Score Latch: Repeated attempts needed to sustain latch, nipple held in mouth throughout feeding, stimulation needed to elicit sucking reflex.  Audible Swallowing: A few with stimulation  Type of Nipple: Everted at rest and after stimulation  Comfort (Breast/Nipple): Soft / non-tender  Hold (Positioning): Full assist, staff holds infant at breast  LATCH Score: 6   Lactation Tools  Discussed/Used Tools: Pump Breast pump type: Double-Electric Breast Pump Pump Education: Setup, frequency, and cleaning;Milk Storage Reason for Pumping: ETI female infant, 2nd C/S delivery and help stimulate and establish milk supply. Pumping frequency: Birth Parent will continue to use DEBP every 3 hours for 15 minutes on inital setting.  Interventions Interventions: Breast feeding basics reviewed;Support pillows;Adjust position;Assisted with latch;Skin to skin;Position options;Education;DEBP;Breast compression;LC Services brochure;Pace feeding  Discharge    Consult Status Consult Status: Follow-up Date: 08/04/22 Follow-up type: In-patient    Eulis Canner 08/04/2022, 3:24 AM

## 2022-08-04 NOTE — Lactation Note (Signed)
This note was copied from a baby's chart. Lactation Consultation Note  Patient Name: Brooke Bryant ZDGLO'V Date: 08/04/2022 Reason for consult: Follow-up assessment;Early term 37-38.6wks;Breastfeeding assistance (Gestational hypertension and 2nd C-section; see patient medical history) Age:36 hours  LC entered the room and the infant was asleep in the bassinet.  Per the birth parent, things have been going well with breastfeeding.  The birth parent stated that she feels much more confident with breastfeeding this time.  She said  that the infant has been feeding well and denies any pain.  LC spoke with the birth parent about latching the infant, switching sides when breastfeeding, stimulating the breasts, and supply & demand.  The infant began showing feeding cues.  LC assisted the birth parent with support pillows.  The birth parent put the infant to the left breast in the football position.  The infant latched deeply with lips flanged, sucking was rhythmic, some jaw extensions were noted.  The infant began to fall asleep at the breast after 5 min.  The birth parent burped the infant.  The infant was fed 18mL of DBM.  LC changed a wet diaper and placed the infant STS.  All questions were answered.  [place] left her name on the board.  The birth parent will call RN/LC for assistance with breastfeeding.   Maternal Data Has patient been taught Hand Expression?: Yes  Feeding Mother's Current Feeding Choice: Breast Milk and Donor Milk  LATCH Score Latch: Grasps breast easily, tongue down, lips flanged, rhythmical sucking.  Audible Swallowing: A few with stimulation  Type of Nipple: Everted at rest and after stimulation  Comfort (Breast/Nipple): Soft / non-tender  Hold (Positioning): No assistance needed to correctly position infant at breast.  LATCH Score: 9  Lactation Tools Discussed/Used Pumping frequency: q3hrs  Interventions Interventions: Support pillows;Adjust  position;Skin to skin;Education  Consult Status Consult Status: Follow-up Date: 08/05/22 Follow-up type: In-patient   Lysbeth Penner 08/04/2022, 12:19 PM

## 2022-08-04 NOTE — Discharge Summary (Signed)
Postpartum Discharge Summary     Patient Name: Brooke Bryant DOB: 08-Mar-1986 MRN: [id number]  Date of admission: 08/03/2022 Delivery date:08/03/2022  Delivering provider: CONSTANT, PEGGY  Date of discharge: 08/05/2022  Admitting diagnosis: Gestational hypertension [O13.9] Status post repeat low transverse cesarean section [Z98.891] Intrauterine pregnancy: [redacted]w[redacted]d    Secondary diagnosis:  Principal Problem:   Status post repeat low transverse cesarean section Active Problems:   Supervision of other normal pregnancy, antepartum   Obesity complicating pregnancy   Advanced maternal age in multigravida   Gestational hypertension   Status post bilateral salpingectomy  Additional problems: none    Discharge diagnosis: Term Pregnancy Delivered and Gestational Hypertension                                              Post partum procedures:postpartum tubal ligation, intra op. Augmentation: N/A Complications: None  Hospital course: Sceduled C/S   37y.o. yo G2P1001 at 347w0das admitted to the hospital 08/03/2022 for scheduled cesarean section with the following indication:Elective Repeat.Delivery details are as follows:  Membrane Rupture Time/Date: 10:52 PM ,08/03/2022   Delivery Method:C-Section, Low Transverse  Details of operation can be found in separate operative note.  Patient had a postpartum course complicated by none. She was started on a 5 day course of furosemide per protocol due to gHTN.  She is ambulating, tolerating a regular diet, passing flatus, and urinating well. Patient is discharged home in stable condition on  08/05/22        Newborn Data: Birth date:08/03/2022  Birth time:10:53 PM  Gender:Female  Living status:Living  Apgars:3 ,9  Weight:2790 g     Magnesium Sulfate received: No BMZ received: No Rhophylac:N/A MMR:N/A T-DaP:Given prenatally Flu: Yes Transfusion:No  Physical exam  Vitals:   08/04/22 1415 08/04/22 1845 08/04/22 2221 08/05/22 0603  BP: 130/61  116/65 127/66 104/64  Pulse: 72 70 75 88  Resp: _0 Temp: 97.9 F (36.6 C) 98.2 F (36.8 C) 97.8 F (36.6 C) 98 F (36.7 C)  TempSrc: Axillary Oral Oral Oral  SpO2: 98% 98% 98% 100%  Weight:      Height:       General: alert, cooperative, and no distress Lochia: appropriate Uterine Fundus: firm Incision: Dressing is clean, dry, and intact DVT Evaluation: No evidence of DVT seen on physical exam. Labs: Lab Results  Component Value Date   WBC 14.9 (H) 08/04/2022   HGB 11.9 (L) 08/04/2022   HCT 34.4 (L) 08/04/2022   MCV 85.1 08/04/2022   PLT 295 08/04/2022      Latest Ref Rng & Units 08/03/2022    4:24 PM  CMP  Glucose 70 - 99 mg/dL 118   BUN 6 - 20 mg/dL 12   Creatinine 0.44 - 1.00 mg/dL 0.66   Sodium 135 - 145 mmol/L 133   Potassium 3.5 - 5.1 mmol/L 3.6   Chloride 98 - 111 mmol/L 103   CO2 22 - 32 mmol/L 22   Calcium 8.9 - 10.3 mg/dL 8.7   Total Protein 6.5 - 8.1 g/dL 5.9   Total Bilirubin 0.3 - 1.2 mg/dL 0.3   Alkaline Phos 38 - 126 U/L 132   AST 15 - 41 U/L 20   ALT 0 - 44 U/L 19    Edinburgh Score:    08/04/2022    6:46 PM  EdFlavia Shipperostnatal  Depression Scale Screening Tool  I have been able to laugh and see the funny side of things. 0  I have looked forward with enjoyment to things. 0  I have blamed myself unnecessarily when things went wrong. 1  I have been anxious or worried for no good reason. 0  I have felt scared or panicky for no good reason. 0  Things have been getting on top of me. 1  I have been so unhappy that I have had difficulty sleeping. 0  I have felt sad or miserable. 0  I have been so unhappy that I have been crying. 0  The thought of harming myself has occurred to me. 0  Edinburgh Postnatal Depression Scale Total 2     After visit meds:  Allergies as of 08/05/2022   No Known Allergies      Medication List     STOP taking these medications    aspirin 81 MG chewable tablet       TAKE these medications     furosemide 20 MG tablet Commonly known as: LASIX Take 1 tablet (20 mg total) by mouth daily for 5 days. Start taking on: August 06, 2022   ibuprofen 600 MG tablet Commonly known as: ADVIL Take 1 tablet (600 mg total) by mouth every 6 (six) hours.   oxyCODONE 5 MG immediate release tablet Commonly known as: Oxy IR/ROXICODONE Take 1 tablet (5 mg total) by mouth every 6 (six) hours as needed for moderate pain.   PRENATAL GUMMIES PO Take 2 tablets by mouth daily.         Discharge home in stable condition Infant Feeding: Breast Infant Disposition:home with mother Discharge instruction: per After Visit Summary and Postpartum booklet. Activity: Advance as tolerated. Pelvic rest for 6 weeks.  Diet: routine diet Future Appointments: Future Appointments  Date Time Provider [place]  08/11/2022  3:00 PM [place] None  09/20/2022  1:10 PM Griffin Basil, MD [place] None   Follow up Visit: as scheduled.  Liliane Channel MD MPH OB Fellow, [place] [facility] [place] 08/05/2022

## 2022-08-04 NOTE — Transfer of Care (Signed)
Immediate Anesthesia Transfer of Care Note  Patient: Brooke Bryant  Procedure(s) Performed: CESAREAN SECTION BILATERAL TUBAL LIGATION (Bilateral)  Patient Location: PACU  Anesthesia Type:Spinal  Level of Consciousness: awake, alert , and oriented  Airway & Oxygen Therapy: Patient Spontanous Breathing  Post-op Assessment: Report given to RN and Post -op Vital signs reviewed and stable  Post vital signs: Reviewed and stable  Last Vitals:  Vitals Value Taken Time  BP    Temp    Pulse 73 08/04/22 0004  Resp 16 08/04/22 0004  SpO2 100 % 08/04/22 0004  Vitals shown include unvalidated device data.  Last Pain:  Vitals:   08/03/22 1549  TempSrc: Oral         Complications: No notable events documented.

## 2022-08-05 LAB — SURGICAL PATHOLOGY

## 2022-08-05 MED ORDER — IBUPROFEN 600 MG PO TABS: 600.0000 mg | ORAL_TABLET | Freq: Four times a day (QID) | ORAL | 0 refills | Status: DC

## 2022-08-05 MED ORDER — FUROSEMIDE 20 MG PO TABS
20.0000 mg | ORAL_TABLET | Freq: Every day | ORAL | Status: DC
  Administered 2022-08-05: 20 mg via ORAL
  Filled 2022-08-05: qty 1

## 2022-08-05 MED ORDER — FUROSEMIDE 20 MG PO TABS: 20.0000 mg | ORAL_TABLET | Freq: Every day | ORAL | 0 refills | Status: DC

## 2022-08-05 MED ORDER — OXYCODONE HCL 5 MG PO TABS: 5.0000 mg | ORAL_TABLET | Freq: Four times a day (QID) | ORAL | 0 refills | Status: DC | PRN

## 2022-08-05 NOTE — Lactation Note (Signed)
This note was copied from a baby's chart. Lactation Consultation Note  Patient Name: Brooke Bryant INOMV'E Date: 08/05/2022 Reason for consult: Follow-up assessment;Early term 37-38.6wks Age:36 hours   P2: Early term infant at 37+6 weeks Feeding preference: Breast/formula Weight loss: 6%  Mother is interested in being discharged today; pediatrician wanted a lactation consult prior to discharge.  Offered to assist with latching; mother receptive.  Baby latched easily but quickly became frustrated after 2-3 sucks at the breast.  Showed mother proper body alignment and positioning at the breast.  Repeated attempts made to latch and feed, however, baby became too frustrated.  Suggested she provide the formula supplementation since this will be what mother uses after discharge.  Demonstrated paced bottle feeding and burped well after 10 mls using the yellow slow flow nipple.  Continued feeding and baby easily consumed an additional 20 mls.  Burped well and feel asleep.  Suggested mother continue to latch first prior to giving formula supplementation at home and to increase volumes to 30+ as baby desires.  Mother will also continue pumping.  She currently has the manual pump only so will use our electric pump right before leaving today.  She is expecting her electric pump to arrive tomorrow.    No support person present at this time.   Maternal Data    Feeding Mother's Current Feeding Choice: Breast Milk and Formula  LATCH Score Latch: Too sleepy or reluctant, no latch achieved, no sucking elicited.  Audible Swallowing: None  Type of Nipple: Everted at rest and after stimulation (Short shafted)  Comfort (Breast/Nipple): Soft / non-tender  Hold (Positioning): Assistance needed to correctly position infant at breast and maintain latch.  LATCH Score: 5   Lactation Tools Discussed/Used    Interventions Interventions: Breast feeding basics reviewed;Assisted with latch;Skin  to skin;Breast massage;Hand express;Breast compression;Position options;Support pillows;Adjust position;Education  Discharge Pump: Personal  Consult Status Consult Status: Complete Date: 08/05/22 Follow-up type: Call as needed    Lanice Schwab Bevelyn Arriola 08/05/2022, 3:29 PM

## 2022-08-05 NOTE — Discharge Instructions (Addendum)
Take off the honeycomb dressing 5 days after surgery as specified.  2.  Follow-up outpatient in 1 week for an incision check and a BP check and in 4 to 6 weeks, as scheduled for your postpartum visit.   3.  Take Tylenol 1000 mg and ibuprofen 600 mg scheduled times a day for the next few days, to help with your pain.  You can also take oxycodone as needed for breakthrough pain.  4. Take furosemide as prescribed for a total of 5 days.  5. Eat some iron rich food help boost up your blood levels.  6.  Please call if you start to experience increased vaginal bleeding requiring a change of pads every 2 hours, sudden onset severe headache, right abdominal pain, flashes of light in a vision or any concerns for new elevated blood pressure.

## 2022-08-10 ENCOUNTER — Telehealth (HOSPITAL_COMMUNITY): Payer: Self-pay | Admitting: *Deleted

## 2022-08-10 ENCOUNTER — Encounter: Payer: BC Managed Care – PPO | Admitting: Family Medicine

## 2022-08-10 ENCOUNTER — Ambulatory Visit: Payer: BC Managed Care – PPO

## 2022-08-10 NOTE — Telephone Encounter (Signed)
Attempted hospital discharge follow-up call. Left message for patient to return RN call with any questions or concerns. Erline Levine, RN, 08/10/22, [phone]

## 2022-08-11 ENCOUNTER — Ambulatory Visit (INDEPENDENT_AMBULATORY_CARE_PROVIDER_SITE_OTHER): Payer: BC Managed Care – PPO

## 2022-08-11 VITALS — BP 130/82 | HR 83 | Wt 340.7 lb

## 2022-08-11 DIAGNOSIS — Z0130 Encounter for examination of blood pressure without abnormal findings: Secondary | ICD-10-CM

## 2022-08-11 DIAGNOSIS — Z4889 Encounter for other specified surgical aftercare: Secondary | ICD-10-CM

## 2022-08-11 NOTE — Progress Notes (Signed)
Subjective:  Brooke Bryant is a 37 y.o. female here for BP check.   Hypertension ROS: taking medications as instructed, no medication side effects noted, no TIA's, no chest pain on exertion, no dyspnea on exertion, and no swelling of ankles.    Objective:  LMP 11/11/2021   Appearance alert, well appearing, and in no distress. General exam BP noted to be well controlled today in office.    Assessment:   Blood Pressure well controlled.   Plan:  Current treatment plan is effective, no change in therapy. Follow up at postpartum visit.   The wound is cleansed, debrided of foreign material as much as possible, and dressed. The patient is alerted to watch for any signs of infection (redness, pus, pain, increased swelling or fever) and call if such occurs. Home wound care instructions are provided. Tetanus vaccination status reviewed: last tetanus booster within 10 years.   Patient has a Prevena wound vac in place. Wound vac removed without difficulty. Raw area noticed midline of incision. Had area evaluated by Dr. Elgie Congo. Instruction given to patient to keep area clean and dry. Signs of infection reviewed with patient and when to be evaluated. Patient verbalized understanding.

## 2022-08-12 ENCOUNTER — Telehealth: Payer: Self-pay | Admitting: Physician Assistant

## 2022-08-12 NOTE — Telephone Encounter (Signed)
Patient states: - [facility] informed her they faxed over order request for a breast pump  - She will need this order completed in order to get medicaid to pay for breast pump   Can you confirm that order request was received?

## 2022-08-12 NOTE — Telephone Encounter (Signed)
Havelyn, I got the fax Alyse Low brought to me. I will have Samantha sign it and I will fax it back to [employer]. Please let pt know. Thanks.

## 2022-08-12 NOTE — Telephone Encounter (Signed)
Havelyn, I have not received any fax, it should go to her OB provider.

## 2022-08-13 ENCOUNTER — Telehealth: Payer: Self-pay

## 2022-08-13 NOTE — Telephone Encounter (Signed)
Returned call, no answer, left vm 

## 2022-08-18 ENCOUNTER — Inpatient Hospital Stay (HOSPITAL_COMMUNITY): Admit: 2022-08-18 | Payer: BC Managed Care – PPO | Admitting: Obstetrics and Gynecology

## 2022-08-18 DIAGNOSIS — Z3482 Encounter for supervision of other normal pregnancy, second trimester: Secondary | ICD-10-CM | POA: Diagnosis not present

## 2022-08-18 DIAGNOSIS — Z3483 Encounter for supervision of other normal pregnancy, third trimester: Secondary | ICD-10-CM | POA: Diagnosis not present

## 2022-09-20 ENCOUNTER — Telehealth: Payer: BC Managed Care – PPO | Admitting: Obstetrics and Gynecology

## 2022-09-20 NOTE — Progress Notes (Signed)
Provider location: [facility] [place]   Patient location: Home  I connected with [facility] on 09/20/22 at  1:10 PM EST by Mychart Video Encounter and verified that I am speaking with the correct person using two identifiers.       I discussed the limitations, risks, security and privacy concerns of performing an evaluation and management service virtually and the availability of in person appointments. I also discussed with the patient that there may be a patient responsible charge related to this service. The patient expressed understanding and agreed to proceed.  Post Partum Visit Note Subjective:   Brooke Bryant is a 37 y.o. G33P2002 female who presents for a postpartum visit. She is 6 weeks postpartum following a repeat cesarean section and tubal ligation was performed.  I have fully reviewed the prenatal and intrapartum course. The delivery was at 37.6 gestational weeks.  Anesthesia: spinal. Postpartum course has been uncomplicated. Baby is doing well. Baby is feeding by bottle - Similac Sensitive RS. Bleeding  : none and pt is s/p first menses . Bowel function is normal. Bladder function is normal. Patient is sexually active. Contraception method is tubal ligation. Postpartum depression screening: negative.   The pregnancy intention screening data noted above was reviewed. Potential methods of contraception were discussed. The patient elected to proceed with postpartum bilateral salpingectomy   Edinburgh Postnatal Depression Scale - 09/20/22 1323       Edinburgh Postnatal Depression Scale:  In the Past 7 Days   I have been able to laugh and see the funny side of things. 0    I have looked forward with enjoyment to things. 0    I have blamed myself unnecessarily when things went wrong. 0    I have been anxious or worried for no good reason. 0    I have felt scared or panicky for no good reason. 0    Things have been getting on top of me. 1    I  have been so unhappy that I have had difficulty sleeping. 0    I have felt sad or miserable. 0    I have been so unhappy that I have been crying. 0    The thought of harming myself has occurred to me. 0    Edinburgh Postnatal Depression Scale Total 1             The following portions of the patient's history were reviewed and updated as appropriate: allergies, current medications, past family history, past medical history, past social history, past surgical history, and problem list.  Review of Systems Pertinent items are noted in HPI.  Objective:  LMP 09/10/2022 (Exact Date)   Breastfeeding No     General:  Alert, oriented and cooperative. Patient is in no acute distress.  Respiratory: Normal respiratory effort, no problems with respiration noted  Mental Status: Normal mood and affect. Normal behavior. Normal judgment and thought content.  Rest of physical exam deferred due to type of encounter   Assessment:    normal postpartum exam.  Plan:  Essential components of care per ACOG recommendations:  1.  Mood and well being: Patient with negative depression screening today. Reviewed local resources for support.  - Patient does not use tobacco.  - hx of drug use? No    2. Infant care and feeding:  -Patient currently breastmilk feeding? No  -Social determinants of health (SDOH) reviewed in EPIC. No concerns.  3. Sexuality, contraception and birth spacing -  Patient does not want a pregnancy in the next year.  Desired family size is 2 children.  - Reviewed forms of contraception in tiered fashion. Patient desired bilateral tubal ligation today.   - Discussed birth spacing of 18 months  4. Sleep and fatigue -Encouraged family/partner/community support of 4 hrs of uninterrupted sleep to help with mood and fatigue  5. Physical Recovery  - Discussed patients delivery and complications - Patient has urinary incontinence? No - Patient is safe to resume physical and sexual  activity  6.  Health Maintenance - Last pap smear done 3/21 and was normal with negative HPV. Pt will be scheduled for AE and pap   7. Chronic Disease Elevated blood pressure at end of pregnancy - PCP follow up  I provided 20 minutes of face-to-face time during this encounter.    Return in about 3 months (around 12/19/2022) for Annual, in person.  No future appointments.  Griffin Basil, MD [facility], [place]

## 2022-11-15 ENCOUNTER — Encounter: Payer: Self-pay | Admitting: *Deleted

## 2023-05-31 NOTE — Progress Notes (Shared)
Brooke Bryant is a 37 y.o. female here for a new problem.  History of Present Illness:   No chief complaint on file.   HPI  Elevated Blood Pressure  Status post C-section birth 08/03/22.  History of gestational hypertension.   Past Medical History:  Diagnosis Date   Allergy    Arthritis    Left knee   Gestational hypertension 08/03/2022     Social History   Tobacco Use   Smoking status: Former    Current packs/day: 0.00    Average packs/day: 1 pack/day for 12.7 years (12.7 ttl pk-yrs)    Types: Cigarettes    Start date: 11/10/2001    Quit date: 08/02/2014    Years since quitting: 8.8   Smokeless tobacco: Never  Vaping Use   Vaping status: Never Used  Substance Use Topics   Alcohol use: Not Currently    Alcohol/week: 3.0 standard drinks of alcohol    Types: 3 Shots of liquor per week    Comment: Not since confirmed pregnancy   Drug use: No    Past Surgical History:  Procedure Laterality Date   CESAREAN SECTION N/A 02/11/2018   Procedure: CESAREAN SECTION;  Surgeon: Lazaro Arms, MD;  Location: [facility] B[facility]ING SUITES;  Service: Obstetrics;  Laterality: N/A;   CESAREAN SECTION N/A 08/03/2022   Procedure: CESAREAN SECTION;  Surgeon: Catalina Antigua, MD;  Location: [facility] LD ORS;  Service: Obstetrics;  Laterality: N/A;   GUM SURGERY     KNEE SURGERY Left    TUBAL LIGATION Bilateral 08/03/2022   Procedure: BILATERAL TUBAL LIGATION;  Surgeon: Catalina Antigua, MD;  Location: [facility] LD ORS;  Service: Obstetrics;  Laterality: Bilateral;    Family History  Problem Relation Age of Onset   Depression Mother    Varicose Veins Mother    Kidney disease Mother    Heart disease Father    Hypertension Father    Arthritis Maternal Grandfather    Cancer Paternal Aunt        breast cancer     No Known Allergies  Current Medications:   Current Outpatient Medications:    furosemide (LASIX) 20 MG tablet, Take 1 tablet (20 mg total) by mouth daily for 5 days., Disp: 5 tablet, Rfl: 0    ibuprofen (ADVIL) 600 MG tablet, Take 1 tablet (600 mg total) by mouth every 6 (six) hours. (Patient not taking: Reported on 09/20/2022), Disp: 30 tablet, Rfl: 0   oxyCODONE (OXY IR/ROXICODONE) 5 MG immediate release tablet, Take 1 tablet (5 mg total) by mouth every 6 (six) hours as needed for moderate pain. (Patient not taking: Reported on 09/20/2022), Disp: 10 tablet, Rfl: 0   Prenatal MV & Min w/FA-DHA (PRENATAL GUMMIES PO), Take 2 tablets by mouth daily., Disp: , Rfl:    Review of Systems:   ROS  Vitals:   There were no vitals filed for this visit.   There is no height or weight on file to calculate BMI.  Physical Exam:   Physical Exam  Assessment and Plan:   ***   I,Alexander Ruley,acting as a scribe for Jarold Motto, PA.,have documented all relevant documentation on the behalf of Jarold Motto, PA,as directed by  Jarold Motto, PA while in the presence of Jarold Motto, [place].   ***   Jarold Motto, PA-C

## 2023-06-01 ENCOUNTER — Encounter: Payer: Self-pay | Admitting: Physician Assistant

## 2023-06-01 ENCOUNTER — Ambulatory Visit (INDEPENDENT_AMBULATORY_CARE_PROVIDER_SITE_OTHER): Payer: Medicaid Other | Admitting: Physician Assistant

## 2023-06-01 VITALS — BP 136/85 | HR 89 | Temp 97.7°F | Ht 65.0 in | Wt 374.4 lb

## 2023-06-01 DIAGNOSIS — Z23 Encounter for immunization: Secondary | ICD-10-CM

## 2023-06-01 DIAGNOSIS — R030 Elevated blood-pressure reading, without diagnosis of hypertension: Secondary | ICD-10-CM

## 2023-06-01 MED ORDER — HYDROCHLOROTHIAZIDE 12.5 MG PO TABS: 12.5000 mg | ORAL_TABLET | Freq: Every day | ORAL | 1 refills | Status: AC

## 2023-06-01 NOTE — Patient Instructions (Signed)
It was great to see you!  Start hydrochloroTHIAZIDE 12.5 mg daily Check blood pressure 1-2 times per week and log for [place] Avoid salt Limit caffeine Schedule eye exam!  Let's follow-up in 1-3 months for Comprehensive Physical Exam (CPE) preventive care annual visit, sooner if you have concerns.  Take care,  Jarold Motto PA-C

## 2023-06-02 LAB — COMPREHENSIVE METABOLIC PANEL
ALT: 21 U/L (ref 0–35)
AST: 18 U/L (ref 0–37)
Albumin: 3.8 g/dL (ref 3.5–5.2)
Alkaline Phosphatase: 64 U/L (ref 39–117)
BUN: 17 mg/dL (ref 6–23)
CO2: 26 meq/L (ref 19–32)
Calcium: 9.1 mg/dL (ref 8.4–10.5)
Chloride: 102 meq/L (ref 96–112)
Creatinine, Ser: 0.69 mg/dL (ref 0.40–1.20)
GFR: 111.38 mL/min (ref >60.00)
Glucose, Bld: 98 mg/dL (ref 70–99)
Potassium: 3.6 meq/L (ref 3.5–5.1)
Sodium: 137 meq/L (ref 135–145)
Total Bilirubin: 0.3 mg/dL (ref 0.2–1.2)
Total Protein: 6.9 g/dL (ref 6.0–8.3)

## 2023-06-02 LAB — CBC WITH DIFFERENTIAL/PLATELET
Basophils Absolute: 0.1 10*3/uL (ref 0.0–0.1)
Basophils Relative: 0.8 % (ref 0.0–3.0)
Eosinophils Absolute: 0.2 10*3/uL (ref 0.0–0.7)
Eosinophils Relative: 2 % (ref 0.0–5.0)
HCT: 38.6 % (ref 36.0–46.0)
Hemoglobin: 12.3 g/dL (ref 12.0–15.0)
Lymphocytes Relative: 18.9 % (ref 12.0–46.0)
Lymphs Abs: 2.2 10*3/uL (ref 0.7–4.0)
MCHC: 32 g/dL (ref 30.0–36.0)
MCV: 91.6 fL (ref 78.0–100.0)
Monocytes Absolute: 0.8 10*3/uL (ref 0.1–1.0)
Monocytes Relative: 6.7 % (ref 3.0–12.0)
Neutro Abs: 8.2 10*3/uL — ABNORMAL HIGH (ref 1.4–7.7)
Neutrophils Relative %: 71.6 % (ref 43.0–77.0)
Platelets: 301 10*3/uL (ref 150.0–400.0)
RBC: 4.21 Mil/uL (ref 3.87–5.11)
RDW: 13.4 % (ref 11.5–15.5)
WBC: 11.5 10*3/uL — ABNORMAL HIGH (ref 4.0–10.5)

## 2023-06-02 LAB — TSH: TSH: 3.07 u[IU]/mL (ref 0.35–5.50)

## 2023-08-05 ENCOUNTER — Encounter: Payer: Medicaid Other | Admitting: Physician Assistant

## 2023-08-28 ENCOUNTER — Telehealth: Payer: Medicaid Other | Admitting: Family Medicine

## 2023-08-28 DIAGNOSIS — S93409A Sprain of unspecified ligament of unspecified ankle, initial encounter: Secondary | ICD-10-CM

## 2023-08-28 NOTE — Patient Instructions (Signed)
Ankle Sprain  An ankle sprain is a stretch or tear in a ligament in the ankle. Ligaments are tissues that connect bones to each other. The two most common types of ankle sprains are: Inversion sprain. This happens when the foot turns inward and the ankle rolls outward. It affects the ligament on the outside of the foot (lateral ligament). Eversion sprain. This happens when the foot turns outward and the ankle rolls inward. It affects the ligament on the inner side of the foot (medial ligament). What are the causes? An ankle sprain is often caused by rolling or twisting the ankle by accident. What increases the risk? You are more likely to get an ankle sprain if you play sports. What are the signs or symptoms? Symptoms of an ankle sprain include: Pain in your ankle. Swelling. Bruising. Bruises may form right after you sprain your ankle or 1-2 days later. Trouble standing or walking. This includes trouble turning or changing directions. How is this diagnosed? An ankle sprain is diagnosed with a physical exam. Your health care provider will press on parts of your foot and ankle and try to move them in certain ways. You may also have X-rays taken. These may be done to see how severe the sprain is and to check for broken bones. How is this treated? An ankle sprain may be treated with: A brace or splint. This is used to keep the ankle from moving until it heals. An elastic bandage (dressing). This is used to support the ankle. Crutches. Pain medicine. Surgery. This may be needed if the sprain is severe. Physical therapy. This may help to improve the range of motion in the ankle. Follow these instructions at home: If you have a removable brace or a splint: Wear the brace or splint as told by your provider. Remove it only as told by your provider. Check the skin around the brace or splint every day. Tell your provider about any concerns. Loosen the brace or splint if your toes tingle, become  numb, or turn cold and blue. Keep the brace or splint clean. If the brace or splint is not waterproof: Do not let it get wet. Cover it with a watertight covering when you take a bath or a shower. If you have an elastic dressing: Take the dressing off to shower or bathe. If the dressing feels too tight, adjust it to make it more comfortable. Loosen the dressing if your foot tingles, becomes numb, or turns cold and blue. Managing pain, stiffness, and swelling If told, put ice on the affected area. If you have a removable brace or splint, remove it as told by your provider. Put ice in a plastic bag. Place a towel between your skin and the bag. Leave the ice on for 20 minutes, 2-3 times a day. Remove the ice if your skin turns bright red. This is very important. If you cannot feel pain, heat, or cold, you have a greater risk of damage to the area. If your skin turns bright red, remove the ice right away to prevent skin damage. The risk of damage is higher if you cannot feel pain, heat, or cold. Move your toes often to reduce stiffness and swelling. For 2-3 days, raise (elevate) your ankle above the level of your heart while you are sitting or lying down. General instructions Take over-the-counter and prescription medicines only as told by your provider. Do not use any products that contain nicotine or tobacco. These products include cigarettes, chewing tobacco, and  vaping devices, such as e-cigarettes. If you need help quitting, ask your provider. Rest your ankle. Do not use your ankle to support your body weight until your provider says that you can. Use crutches as told by your provider. Ask your provider when it is safe to drive if you have a brace or splint on your ankle. Contact a health care provider if: You have bruising or swelling that get worse all of a sudden. Your pain does not get better with medicine. Get help right away if: Your foot or toes become numb or blue. You have  severe pain that gets worse. This information is not intended to replace advice given to you by your health care provider. Make sure you discuss any questions you have with your health care provider. Document Revised: 04/21/2022 Document Reviewed: 04/21/2022 Elsevier Patient Education  2024 [employer].

## 2023-08-28 NOTE — Progress Notes (Signed)
Virtual Visit Consent   Korbyn A Palladino, you are scheduled for a virtual visit with a  provider today. Just as with appointments in the office, your consent must be obtained to participate. Your consent will be active for this visit and any virtual visit you may have with one of our providers in the next 365 days. If you have a MyChart account, a copy of this consent can be sent to you electronically.  As this is a virtual visit, video technology does not allow for your provider to perform a traditional examination. This may limit your provider's ability to fully assess your condition. If your provider identifies any concerns that need to be evaluated in person or the need to arrange testing (such as labs, EKG, etc.), we will make arrangements to do so. Although advances in technology are sophisticated, we cannot ensure that it will always work on either your end or our end. If the connection with a video visit is poor, the visit may have to be switched to a telephone visit. With either a video or telephone visit, we are not always able to ensure that we have a secure connection.  By engaging in this virtual visit, you consent to the provision of healthcare and authorize for your insurance to be billed (if applicable) for the services provided during this visit. Depending on your insurance coverage, you may receive a charge related to this service.  I need to obtain your verbal consent now. Are you willing to proceed with your visit today? Kynedi A Matthew has provided verbal consent on 08/28/2023 for a virtual visit (video or telephone). Georgana Curio, FNP  Date: 08/28/2023 1:30 PM  Virtual Visit via Video Note   I, Georgana Curio, connected with  CHRISANN MELARAGNO  ([id number], 1986/04/02) on 08/28/23 at 12:45 PM EST by a video-enabled telemedicine application and verified that I am speaking with the correct person using two identifiers.  Location: Patient: Virtual Visit Location  Patient: Home Provider: Virtual Visit Location Provider: Home Office   I discussed the limitations of evaluation and management by telemedicine and the availability of in person appointments. The patient expressed understanding and agreed to proceed.    History of Present Illness: Renesmae A Clements is a 38 y.o. who identifies as a female who was assigned female at birth, and is being seen today for ankle sprain yesterday. She has not had xr's. She requests a work note for tomorrow. [name]  HPI: HPI  Problems:  Patient Active Problem List   Diagnosis Date Noted   Status post bilateral salpingectomy 08/04/2022   Gestational hypertension 08/03/2022   BMI 50.0-59.9, adult (HCC) 02/04/2022   Obesity complicating pregnancy 02/04/2022   Advanced maternal age in multigravida 02/04/2022   Status post repeat low transverse cesarean section 02/04/2022   Supervision of other normal pregnancy, antepartum 02/01/2022    Allergies: No Known Allergies Medications:  Current Outpatient Medications:    hydrochlorothiazide (HYDRODIURIL) 12.5 MG tablet, Take 1 tablet (12.5 mg total) by mouth daily., Disp: 30 tablet, Rfl: 1   ibuprofen (ADVIL) 200 MG tablet, Take 400 mg by mouth every 6 (six) hours as needed., Disp: , Rfl:    Prenatal MV & Min w/FA-DHA (PRENATAL GUMMIES PO), Take 2 tablets by mouth daily., Disp: , Rfl:   Observations/Objective: Patient is well-developed, well-nourished in no acute distress.  Resting comfortably  at home.  Head is normocephalic, atraumatic.  No labored breathing. Speech is clear and coherent with logical content.  Patient is  alert and oriented at baseline.    Assessment and Plan: 1. Sprain of ankle, unspecified laterality, unspecified ligament, initial encounter (Primary)  Rest, ice, elevate, compression, UC if sx persist or worsen   Follow Up Instructions: I discussed the assessment and treatment plan with the patient. The patient was provided an opportunity to ask  questions and all were answered. The patient agreed with the plan and demonstrated an understanding of the instructions.  A copy of instructions were sent to the patient via MyChart unless otherwise noted below.     The patient was advised to call back or seek an in-person evaluation if the symptoms worsen or if the condition fails to improve as anticipated.    Georgana Curio, FNP

## 2023-08-29 DIAGNOSIS — S93401A Sprain of unspecified ligament of right ankle, initial encounter: Secondary | ICD-10-CM | POA: Diagnosis not present

## 2023-08-29 DIAGNOSIS — M25571 Pain in right ankle and joints of right foot: Secondary | ICD-10-CM | POA: Diagnosis not present

## 2023-08-29 DIAGNOSIS — X501XXA Overexertion from prolonged static or awkward postures, initial encounter: Secondary | ICD-10-CM | POA: Diagnosis not present

## 2023-08-29 DIAGNOSIS — M7989 Other specified soft tissue disorders: Secondary | ICD-10-CM | POA: Diagnosis not present

## 2023-12-06 ENCOUNTER — Emergency Department (HOSPITAL_BASED_OUTPATIENT_CLINIC_OR_DEPARTMENT_OTHER)

## 2023-12-06 ENCOUNTER — Emergency Department (HOSPITAL_BASED_OUTPATIENT_CLINIC_OR_DEPARTMENT_OTHER): Admitting: Radiology

## 2023-12-06 ENCOUNTER — Emergency Department (HOSPITAL_BASED_OUTPATIENT_CLINIC_OR_DEPARTMENT_OTHER)
Admission: EM | Admit: 2023-12-06 | Discharge: 2023-12-06 | Disposition: A | Discharge status: Home / Self Care | Attending: Emergency Medicine | Admitting: Emergency Medicine

## 2023-12-06 ENCOUNTER — Other Ambulatory Visit: Payer: Self-pay

## 2023-12-06 ENCOUNTER — Encounter (HOSPITAL_BASED_OUTPATIENT_CLINIC_OR_DEPARTMENT_OTHER): Payer: Self-pay

## 2023-12-06 DIAGNOSIS — R0789 Other chest pain: Secondary | ICD-10-CM | POA: Insufficient documentation

## 2023-12-06 DIAGNOSIS — R062 Wheezing: Secondary | ICD-10-CM | POA: Insufficient documentation

## 2023-12-06 DIAGNOSIS — R079 Chest pain, unspecified: Secondary | ICD-10-CM

## 2023-12-06 LAB — COMPREHENSIVE METABOLIC PANEL WITH GFR
ALT: 34 U/L (ref 0–44)
AST: 35 U/L (ref 15–41)
Albumin: 3.9 g/dL (ref 3.5–5.0)
Alkaline Phosphatase: 78 U/L (ref 38–126)
Anion gap: 11 (ref 5–15)
BUN: 16 mg/dL (ref 6–20)
CO2: 24 mmol/L (ref 22–32)
Calcium: 9.3 mg/dL (ref 8.9–10.3)
Chloride: 103 mmol/L (ref 98–111)
Creatinine, Ser: 0.8 mg/dL (ref 0.44–1.00)
GFR, Estimated: >60 mL/min (ref >60)
Glucose, Bld: 107 mg/dL — ABNORMAL HIGH (ref 70–99)
Potassium: 3.6 mmol/L (ref 3.5–5.1)
Sodium: 138 mmol/L (ref 135–145)
Total Bilirubin: <0.2 mg/dL (ref 0.0–1.2)
Total Protein: 6.4 g/dL — ABNORMAL LOW (ref 6.5–8.1)

## 2023-12-06 LAB — CBC WITH DIFFERENTIAL/PLATELET
Abs Immature Granulocytes: 0.04 10*3/uL (ref 0.00–0.07)
Basophils Absolute: 0.1 10*3/uL (ref 0.0–0.1)
Basophils Relative: 0 %
Eosinophils Absolute: 0.4 10*3/uL (ref 0.0–0.5)
Eosinophils Relative: 3 %
HCT: 39.2 % (ref 36.0–46.0)
Hemoglobin: 13.2 g/dL (ref 12.0–15.0)
Immature Granulocytes: 0 %
Lymphocytes Relative: 23 %
Lymphs Abs: 2.6 10*3/uL (ref 0.7–4.0)
MCH: 30.5 pg (ref 26.0–34.0)
MCHC: 33.7 g/dL (ref 30.0–36.0)
MCV: 90.5 fL (ref 80.0–100.0)
Monocytes Absolute: 0.8 10*3/uL (ref 0.1–1.0)
Monocytes Relative: 7 %
Neutro Abs: 7.6 10*3/uL (ref 1.7–7.7)
Neutrophils Relative %: 67 %
Platelets: 307 10*3/uL (ref 150–400)
RBC: 4.33 MIL/uL (ref 3.87–5.11)
RDW: 13.1 % (ref 11.5–15.5)
WBC: 11.4 10*3/uL — ABNORMAL HIGH (ref 4.0–10.5)
nRBC: 0 % (ref 0.0–0.2)

## 2023-12-06 LAB — LIPASE, BLOOD: Lipase: 67 U/L — ABNORMAL HIGH (ref 11–51)

## 2023-12-06 LAB — HCG, QUANTITATIVE, PREGNANCY: hCG, Beta Chain, Quant, S: <1 m[IU]/mL (ref <5)

## 2023-12-06 LAB — TROPONIN T, HIGH SENSITIVITY: Troponin T High Sensitivity: <15 ng/L (ref <19)

## 2023-12-06 MED ORDER — ALBUTEROL SULFATE HFA 108 (90 BASE) MCG/ACT IN AERS
2.0000 | INHALATION_SPRAY | Freq: Once | RESPIRATORY_TRACT | Status: AC
  Administered 2023-12-06: 2 via RESPIRATORY_TRACT
  Filled 2023-12-06: qty 6.7

## 2023-12-06 MED ORDER — KETOROLAC TROMETHAMINE 30 MG/ML IJ SOLN
15.0000 mg | Freq: Once | INTRAMUSCULAR | Status: AC
  Administered 2023-12-06: 15 mg via INTRAVENOUS
  Filled 2023-12-06: qty 1

## 2023-12-06 NOTE — ED Triage Notes (Signed)
 Pt reports she is here today due to chest pain. Pt reports she developed chest pain around midnight. Pt reports it woke her up put of her sleep. Pt reports pressure pain 6/10. Pt reports mild sob.

## 2023-12-06 NOTE — ED Provider Notes (Signed)
 [facility] AT [facility] Provider Note   CSN: [id number] Arrival date & time: 12/06/23  0112     History  Chief Complaint  Patient presents with   Chest Pain    Brooke Bryant is a 38 y.o. female.  38 yo F here with upper chest pain. Retrorsternal somewhat sharp. No associated trauma, fever, cough, nausea, diaphoresis, emesis, lightheadedness, dyspnea, rash. No h/o same. Woke her up from sleep, thought it might be indigestion but didn't improve so came here to get checked out. No contraceptives, LMP finished last week. Somewhat worse with deep breathing.    Chest Pain      Home Medications Prior to Admission medications   Medication Sig Start Date End Date Taking? Authorizing Provider  hydrochlorothiazide  (HYDRODIURIL ) 12.5 MG tablet Take 1 tablet (12.5 mg total) by mouth daily. 06/01/23   Alexander Iba, PA  ibuprofen  (ADVIL ) 200 MG tablet Take 400 mg by mouth every 6 (six) hours as needed.    [provider]  Prenatal MV & Min w/FA-DHA (PRENATAL GUMMIES PO) Take 2 tablets by mouth daily.    [provider]      Allergies    Patient has no known allergies.    Review of Systems   Review of Systems  Cardiovascular:  Positive for chest pain.    Physical Exam Updated Vital Signs BP 118/62   Pulse 100   Temp 98 F (36.7 C)   Resp 14   Ht 5\' 5"  (1.651 m)   Wt (!) 163.3 kg   LMP 11/30/2023   SpO2 97%   BMI 59.91 kg/m  Physical Exam Vitals and nursing note reviewed.  Constitutional:      Appearance: She is well-developed.  HENT:     Head: Normocephalic and atraumatic.  Cardiovascular:     Rate and Rhythm: Normal rate and regular rhythm.  Pulmonary:     Effort: No respiratory distress.     Breath sounds: No stridor. Examination of the right-upper field reveals wheezing. Examination of the left-upper field reveals wheezing. Wheezing present.  Abdominal:     General: There is no distension.   Musculoskeletal:     Cervical back: Normal range of motion.     Right lower leg: No edema.     Left lower leg: No edema.  Neurological:     Mental Status: She is alert.     ED Results / Procedures / Treatments   Labs (all labs ordered are listed, but only abnormal results are displayed) Labs Reviewed  CBC WITH DIFFERENTIAL/PLATELET - Abnormal; Notable for the following components:      Result Value   WBC 11.4 (*)    All other components within normal limits  COMPREHENSIVE METABOLIC PANEL WITH GFR - Abnormal; Notable for the following components:   Glucose, Bld 107 (*)    Total Protein 6.4 (*)    All other components within normal limits  LIPASE, BLOOD - Abnormal; Notable for the following components:   Lipase 67 (*)    All other components within normal limits  HCG, QUANTITATIVE, PREGNANCY  TROPONIN T, HIGH SENSITIVITY    EKG EKG Interpretation Date/Time:  Tuesday Dec 06 2023 01:22:41 EDT Ventricular Rate:  93 PR Interval:  166 QRS Duration:  81 QT Interval:  361 QTC Calculation: 449 R Axis:   75  Text Interpretation: Sinus rhythm Probable anteroseptal infarct, old Confirmed by Eve Hinders [phone]) on 12/06/2023 3:18:25 AM  Radiology DG Chest Portable 1 View Result Date:  12/06/2023 CLINICAL DATA:  Shortness of breath EXAM: PORTABLE CHEST 1 VIEW COMPARISON:  None Available. FINDINGS: The heart size and mediastinal contours are within normal limits. Both lungs are clear. The visualized skeletal structures are unremarkable. IMPRESSION: No active disease. Electronically Signed   By: Violeta Grey M.D.   On: 12/06/2023 02:44    Procedures Procedures    Medications Ordered in ED Medications  albuterol (VENTOLIN HFA) 108 (90 Base) MCG/ACT inhaler 2 puff (2 puffs Inhalation Given 12/06/23 0229)  ketorolac  (TORADOL ) 30 MG/ML injection 15 mg (15 mg Intravenous Given 12/06/23 0228)    ED Course/ Medical Decision Making/ A&P                                 Medical Decision  Making Amount and/or Complexity of Data Reviewed Labs: ordered. Radiology: ordered.  Risk Prescription drug management.   Patient with wheezing R>L for no obvious reason. Low risk for PE, PERC negative.  Low risk for ACS - ecg reassuring will check troponin, cxr.  Asthma/bronchitis causing pleurisy? Will try albuterol - prednisone  Workup reassuring. Pain improved. Unsure of cause. Will fu w/ pcp if returns. Here if worsens.   Final Clinical Impression(s) / ED Diagnoses Final diagnoses:  Nonspecific chest pain    Rx / DC Orders ED Discharge Orders     None         Rhonna Holster, Reymundo Caulk, MD 12/06/23 [phone]

## 2024-04-20 ENCOUNTER — Ambulatory Visit: Payer: Self-pay

## 2024-04-20 NOTE — Telephone Encounter (Signed)
 FYI Only or Action Required?: FYI only for provider.  Patient was last seen in primary care on 08/28/2023 by Blair, Diane W, FNP.  Called Nurse Triage reporting Dysuria.  Symptoms began several days ago.  Interventions attempted: Other: increased water.  Symptoms are: gradually worsening.  Triage Disposition: See Physician Within 24 Hours  Patient/caregiver understands and will follow disposition?: Yes - will go to UC                       Copied from CRM #[id number]. Topic: Clinical - Red Word Triage >> Apr 20, 2024 11:02 AM Adelita E wrote: [facility] that prompted transfer to Nurse Triage: Potential UTI. Patient stated she is having vaginal and abdominal pain, increased frequency to go, and a burning sensation. Going on for 3-4 days. Reason for Disposition  Urinating more frequently than usual (i.e., frequency) OR new-onset of the feeling of an urgent need to urinate (i.e., urgency)  Answer Assessment - Initial Assessment Questions 1. SYMPTOM: What's the main symptom you're concerned about? (e.g., frequency, incontinence)     Frequency 2. ONSET: When did the  s/s  start?     3-4 days 3. PAIN: Is there any pain? If Yes, ask: How bad is it? (Scale: 1-10; mild, moderate, severe)     Yes - cramping 4. CAUSE: What do you think is causing the symptoms?     UTI 5. OTHER SYMPTOMS: Do you have any other symptoms? (e.g., blood in urine, fever, flank pain, pain with urination)     Abdominal pain 6. PREGNANCY: Is there any chance you are pregnant? When was your last menstrual period?     no  Protocols used: Urinary Symptoms-A-AH

## 2024-04-23 NOTE — Telephone Encounter (Signed)
 Noted
